# Patient Record
Sex: Female | Born: 1950 | Race: Black or African American | Hispanic: No | Marital: Single | Smoking: Never smoker
Health system: Southern US, Community
[De-identification: ages and names within clinical notes are randomized; demographics above are authoritative.]

## PROBLEM LIST (undated history)

## (undated) DIAGNOSIS — R4189 Other symptoms and signs involving cognitive functions and awareness: Secondary | ICD-10-CM

## (undated) DIAGNOSIS — I10 Essential (primary) hypertension: Secondary | ICD-10-CM

## (undated) DIAGNOSIS — E785 Hyperlipidemia, unspecified: Secondary | ICD-10-CM

## (undated) DIAGNOSIS — I739 Peripheral vascular disease, unspecified: Secondary | ICD-10-CM

## (undated) DIAGNOSIS — D649 Anemia, unspecified: Secondary | ICD-10-CM

## (undated) DIAGNOSIS — K259 Gastric ulcer, unspecified as acute or chronic, without hemorrhage or perforation: Secondary | ICD-10-CM

## (undated) DIAGNOSIS — D563 Thalassemia minor: Secondary | ICD-10-CM

## (undated) HISTORY — PX: NO PAST SURGERIES: SHX2092

## (undated) HISTORY — DX: Other symptoms and signs involving cognitive functions and awareness: R41.89

## (undated) HISTORY — DX: Gastric ulcer, unspecified as acute or chronic, without hemorrhage or perforation: K25.9

## (undated) HISTORY — DX: Peripheral vascular disease, unspecified: I73.9

## (undated) HISTORY — PX: UPPER GASTROINTESTINAL ENDOSCOPY: SHX188

## (undated) HISTORY — DX: Hyperlipidemia, unspecified: E78.5

## (undated) HISTORY — DX: Thalassemia minor: D56.3

## (undated) HISTORY — PX: COLONOSCOPY: SHX174

## (undated) HISTORY — DX: Essential (primary) hypertension: I10

## (undated) HISTORY — DX: Anemia, unspecified: D64.9

---

## 1999-12-03 ENCOUNTER — Emergency Department (HOSPITAL_COMMUNITY): Admission: EM | Admit: 1999-12-03 | Discharge: 1999-12-03 | Payer: Self-pay | Admitting: Emergency Medicine

## 1999-12-04 ENCOUNTER — Encounter: Payer: Self-pay | Admitting: Emergency Medicine

## 2000-08-11 ENCOUNTER — Encounter: Payer: Self-pay | Admitting: Emergency Medicine

## 2000-08-11 ENCOUNTER — Emergency Department (HOSPITAL_COMMUNITY): Admission: EM | Admit: 2000-08-11 | Discharge: 2000-08-11 | Payer: Self-pay | Admitting: Emergency Medicine

## 2000-08-15 ENCOUNTER — Emergency Department (HOSPITAL_COMMUNITY): Admission: EM | Admit: 2000-08-15 | Discharge: 2000-08-15 | Payer: Self-pay | Admitting: Emergency Medicine

## 2000-09-12 ENCOUNTER — Encounter: Payer: Self-pay | Admitting: Emergency Medicine

## 2000-09-12 ENCOUNTER — Emergency Department (HOSPITAL_COMMUNITY): Admission: EM | Admit: 2000-09-12 | Discharge: 2000-09-12 | Payer: Self-pay | Admitting: Emergency Medicine

## 2000-10-07 ENCOUNTER — Emergency Department (HOSPITAL_COMMUNITY): Admission: EM | Admit: 2000-10-07 | Discharge: 2000-10-07 | Payer: Self-pay | Admitting: Emergency Medicine

## 2000-10-11 ENCOUNTER — Emergency Department (HOSPITAL_COMMUNITY): Admission: EM | Admit: 2000-10-11 | Discharge: 2000-10-11 | Payer: Self-pay | Admitting: Emergency Medicine

## 2000-10-18 ENCOUNTER — Emergency Department (HOSPITAL_COMMUNITY): Admission: EM | Admit: 2000-10-18 | Discharge: 2000-10-18 | Payer: Self-pay | Admitting: Emergency Medicine

## 2000-12-26 ENCOUNTER — Emergency Department (HOSPITAL_COMMUNITY): Admission: EM | Admit: 2000-12-26 | Discharge: 2000-12-26 | Payer: Self-pay | Admitting: Emergency Medicine

## 2002-03-02 ENCOUNTER — Encounter: Payer: Self-pay | Admitting: Family Medicine

## 2002-03-02 ENCOUNTER — Encounter: Admission: RE | Admit: 2002-03-02 | Discharge: 2002-03-02 | Payer: Self-pay | Admitting: Family Medicine

## 2002-03-13 ENCOUNTER — Inpatient Hospital Stay (HOSPITAL_COMMUNITY): Admission: EM | Admit: 2002-03-13 | Discharge: 2002-03-13 | Payer: Self-pay | Admitting: Emergency Medicine

## 2002-03-13 ENCOUNTER — Encounter: Payer: Self-pay | Admitting: Emergency Medicine

## 2002-03-16 ENCOUNTER — Encounter: Payer: Self-pay | Admitting: Cardiovascular Disease

## 2002-03-16 ENCOUNTER — Ambulatory Visit (HOSPITAL_COMMUNITY): Admission: RE | Admit: 2002-03-16 | Discharge: 2002-03-16 | Payer: Self-pay | Admitting: Cardiovascular Disease

## 2005-04-29 ENCOUNTER — Emergency Department (HOSPITAL_COMMUNITY): Admission: EM | Admit: 2005-04-29 | Discharge: 2005-04-29 | Payer: Self-pay | Admitting: Emergency Medicine

## 2007-04-03 ENCOUNTER — Emergency Department (HOSPITAL_COMMUNITY): Admission: EM | Admit: 2007-04-03 | Discharge: 2007-04-03 | Payer: Self-pay | Admitting: Family Medicine

## 2008-12-03 DIAGNOSIS — R062 Wheezing: Secondary | ICD-10-CM

## 2008-12-03 DIAGNOSIS — J45909 Unspecified asthma, uncomplicated: Secondary | ICD-10-CM | POA: Insufficient documentation

## 2008-12-05 ENCOUNTER — Ambulatory Visit: Payer: Self-pay | Admitting: Internal Medicine

## 2008-12-05 DIAGNOSIS — H5310 Unspecified subjective visual disturbances: Secondary | ICD-10-CM | POA: Insufficient documentation

## 2008-12-05 DIAGNOSIS — E669 Obesity, unspecified: Secondary | ICD-10-CM

## 2008-12-05 DIAGNOSIS — K029 Dental caries, unspecified: Secondary | ICD-10-CM | POA: Insufficient documentation

## 2010-06-24 ENCOUNTER — Inpatient Hospital Stay (INDEPENDENT_AMBULATORY_CARE_PROVIDER_SITE_OTHER)
Admission: RE | Admit: 2010-06-24 | Discharge: 2010-06-24 | Disposition: A | Discharge status: Home / Self Care | Payer: Medicaid Other | Source: Ambulatory Visit

## 2010-06-24 ENCOUNTER — Ambulatory Visit (INDEPENDENT_AMBULATORY_CARE_PROVIDER_SITE_OTHER): Payer: Medicaid Other

## 2010-06-24 DIAGNOSIS — G8929 Other chronic pain: Secondary | ICD-10-CM

## 2010-06-24 DIAGNOSIS — M79609 Pain in unspecified limb: Secondary | ICD-10-CM

## 2010-07-03 ENCOUNTER — Encounter: Payer: Self-pay | Admitting: Internal Medicine

## 2010-07-03 LAB — CONVERTED CEMR LAB
ALT: 12 units/L (ref 0–35)
Alkaline Phosphatase: 92 units/L (ref 39–117)
Basophils Absolute: 0.1 10*3/uL (ref 0.0–0.1)
Cholesterol: 272 mg/dL — ABNORMAL HIGH (ref 0–200)
Creatinine, Ser: 0.73 mg/dL (ref 0.40–1.20)
Glucose, Bld: 124 mg/dL — ABNORMAL HIGH (ref 70–99)
HCT: 31.4 % — ABNORMAL LOW (ref 36.0–46.0)
HDL: 53 mg/dL (ref >39)
MCHC: 26.8 g/dL — ABNORMAL LOW (ref 30.0–36.0)
MCV: 54.1 fL — ABNORMAL LOW (ref 78.0–100.0)
Monocytes Absolute: 0.5 10*3/uL (ref 0.1–1.0)
Neutrophils Relative %: 42 % — ABNORMAL LOW (ref 43–77)
RBC: 5.8 M/uL — ABNORMAL HIGH (ref 3.87–5.11)
Sodium: 137 meq/L (ref 135–145)
T pallidum Antibodies (TP-PA): >8 — ABNORMAL HIGH (ref <0.90)
Total Bilirubin: 0.3 mg/dL (ref 0.3–1.2)
Total CHOL/HDL Ratio: 5.1
Total Protein: 7.2 g/dL (ref 6.0–8.3)
WBC: 8.3 10*3/uL (ref 4.0–10.5)

## 2010-09-25 NOTE — Discharge Summary (Signed)
NAME:  [name], [name]                         ACCOUNT NO.:  [id number]   MEDICAL RECORD NO.:  [id number]                   PATIENT TYPE:  INP   LOCATION:  6526                                 FACILITY:  [facility]   PHYSICIAN:  Ricki Rodriguez, M.D.               DATE OF BIRTH:  May 27, 1950   DATE OF ADMISSION:  03/12/2002  DATE OF DISCHARGE:  03/13/2002                                 DISCHARGE SUMMARY   PRINCIPAL DIAGNOSIS:  Chest pain.   DISCHARGE MEDICATIONS:  1. Aspirin 81 mg one p.o. daily.  2. Norvasc 2.5 mg one p.o. daily.  3. Nitroglycerin 0.4 mg one tablet one under the tongue as needed every five     minutes x3 for chest pain.   ACTIVITY:  As tolerated.   DIET:  Low fat, low salt diet.   DISCHARGE INSTRUCTIONS:  The patient to have nuclear stress test on March 16, 2002, on outpatient basis.   FOLLOW UP:  The patient will follow up with Dr. Orpah Cobb in two weeks.  The patient is to call [phone] for appointment.   HISTORY OF PRESENT ILLNESS:  This 60 year old black female complained of  dull substernal chest pain nonradiating.  She had similar pain one to three  days prior to the admission. She has no cardiac risk factors.   PHYSICAL EXAMINATION:  GENERAL APPEARANCE:  The patient was alert and  oriented x3.  VITAL SIGNS:  Temperature 99.6, pulse 72, respiratory rate 20, blood  pressure 116/53, height 5 feet 4 inches, weight 180 pounds.  Oxygen  saturation was 98%.  HEENT:  Head normocephalic and atraumatic.  Eyes brown with conjunctiva  pink.  Sclerae nonicteric.  Ears, nose and throat revealed mucous membranes  pink and moist.  NECK:  No JVD.  LUNGS:  Clear bilaterally.  CARDIOVASCULAR:  Normal S1 and S2.  ABDOMEN:  Soft.  EXTREMITIES:  No clubbing, cyanosis, or edema.  NEUROLOGIC:  Bilateral equal grips.   LABORATORY DATA:  CK-MB and troponin I unremarkable.  Elevated cholesterol,  triglyceride levels.  CK-MB normal x2.  Sugar borderline at 125.   Hemoglobin  14, hematocrit 41.   Chest x-ray without any acute disease.   EKG sinus rhythm with a possible inferior infarct.    HOSPITAL COURSE:  The patient was admitted to telemetry and myocardial  infarction was ruled out.  The patient was then encouraged to go for nuclear  stress test versus cardiac catheterization; however, she wanted to go home  and she was given aspirin, Norvasc, nitroglycerin and outpatient nuclear  stress test.  She had had her cholesterol level managed on an outpatient  basis and she was discharged home in satisfactory condition.  Ricki Rodriguez, M.D.    ASK/MEDQ  D:  05/11/2002  T:  05/11/2002  Job:  [id number]

## 2010-12-18 ENCOUNTER — Other Ambulatory Visit: Payer: Self-pay | Admitting: Neurology

## 2010-12-18 DIAGNOSIS — F70 Mild intellectual disabilities: Secondary | ICD-10-CM

## 2011-01-05 ENCOUNTER — Other Ambulatory Visit: Payer: Medicaid Other

## 2011-01-06 ENCOUNTER — Ambulatory Visit
Admission: RE | Admit: 2011-01-06 | Discharge: 2011-01-06 | Disposition: A | Discharge status: Home / Self Care | Payer: Medicaid Other | Source: Ambulatory Visit | Attending: Neurology | Admitting: Neurology

## 2011-01-06 DIAGNOSIS — F70 Mild intellectual disabilities: Secondary | ICD-10-CM

## 2011-10-10 ENCOUNTER — Encounter (HOSPITAL_COMMUNITY): Payer: Self-pay

## 2011-10-10 ENCOUNTER — Emergency Department (HOSPITAL_COMMUNITY)
Admission: EM | Admit: 2011-10-10 | Discharge: 2011-10-10 | Disposition: A | Discharge status: Home / Self Care | Payer: Medicaid Other | Attending: Emergency Medicine | Admitting: Emergency Medicine

## 2011-10-10 DIAGNOSIS — H5710 Ocular pain, unspecified eye: Secondary | ICD-10-CM | POA: Insufficient documentation

## 2011-10-10 DIAGNOSIS — E119 Type 2 diabetes mellitus without complications: Secondary | ICD-10-CM | POA: Insufficient documentation

## 2011-10-10 DIAGNOSIS — H109 Unspecified conjunctivitis: Secondary | ICD-10-CM

## 2011-10-10 LAB — GLUCOSE, CAPILLARY: Glucose-Capillary: 150 mg/dL — ABNORMAL HIGH (ref 70–99)

## 2011-10-10 MED ORDER — TOBRAMYCIN-DEXAMETHASONE 0.3-0.1 % OP SUSP
2.0000 [drp] | Freq: Once | OPHTHALMIC | Status: AC
  Administered 2011-10-10: 2 [drp] via OPHTHALMIC
  Filled 2011-10-10: qty 2.5

## 2011-10-10 NOTE — ED Provider Notes (Signed)
History     CSN: [id number]  Arrival date & time 10/10/11  1306   First MD Initiated Contact with Patient 10/10/11 1354      2:25 PM HPI Patient reports she is ready to go home. I asked her why she came to the emergency department she states she thought her sugar was high but was checked and normal in the emergency department. CBG was 150. When I asked her about her red eye which she was complaining of initially she states "it's fine it's my high sugar" patient reports that his red for 2 days. Associated with excessive tearing and white mucus production in the morning. Denies eye irritation, visual change, eye pain, itchiness, swelling. Patient is a 61 y.o. female presenting with eye problem. The history is provided by the patient.  Eye Problem  This is a new problem. The current episode started 2 days ago. The problem has not changed since onset.There is pain in the right eye. The patient is experiencing no pain. There is no history of trauma to the eye. There is no known exposure to pink eye. She does not wear contacts. Associated symptoms include discharge and eye redness. Pertinent negatives include no numbness, no blurred vision, no double vision, no photophobia, no nausea, no vomiting, no tingling, no weakness and no itching.    Past Medical History  Diagnosis Date  . Diabetes mellitus     No past surgical history on file.  No family history on file.  History  Substance Use Topics  . Smoking status: Never Smoker   . Smokeless tobacco: Not on file  . Alcohol Use: No    OB History    Grav Para Term Preterm Abortions TAB SAB Ect Mult Living                  Review of Systems  Constitutional: Negative for fever and chills.  HENT: Negative for congestion, sore throat, rhinorrhea, neck pain and neck stiffness.   Eyes: Positive for discharge and redness. Negative for blurred vision, double vision, photophobia, pain, itching and visual disturbance.  Gastrointestinal: Negative  for nausea, vomiting and abdominal pain.  Skin: Negative for itching.  Neurological: Negative for dizziness, tingling, weakness, numbness and headaches.  All other systems reviewed and are negative.    Allergies  Review of patient's allergies indicates no known allergies.  Home Medications   Current Outpatient Rx  Name Route Sig Dispense Refill  . ASPIRIN EC 81 MG PO TBEC Oral Take 81 mg by mouth daily.    Marland Kitchen METFORMIN HCL 500 MG PO TABS Oral Take 500 mg by mouth 2 (two) times daily with a meal.    . PRESCRIPTION MEDICATION Oral Take 1 tablet by mouth daily. Cholesterol med.    Marland Kitchen PRESCRIPTION MEDICATION Oral Take 1 tablet by mouth daily. "Suagr pill"      BP 135/63  Pulse 78  Temp 98.4 F (36.9 C)  Resp 18  SpO2 96%  Physical Exam  Vitals reviewed. Constitutional: She is oriented to person, place, and time. Vital signs are normal. She appears well-developed and well-nourished.  HENT:  Head: Normocephalic and atraumatic.  Eyes: EOM are normal. Pupils are equal, round, and reactive to light. Right eye exhibits no discharge. Left eye exhibits no discharge. Right conjunctiva is injected. Left conjunctiva is not injected.  Fundoscopic exam:      The right eye shows no hemorrhage.       The left eye shows no hemorrhage.  Neck: Normal  range of motion. Neck supple.  Cardiovascular: Normal rate, regular rhythm and normal heart sounds.  Exam reveals no friction rub.   No murmur heard. Pulmonary/Chest: Effort normal and breath sounds normal. She has no wheezes. She has no rhonchi. She has no rales. She exhibits no tenderness.  Musculoskeletal: Normal range of motion.  Neurological: She is alert and oriented to person, place, and time.  Skin: Skin is warm and dry. No rash noted. No erythema. No pallor.    ED Course  Procedures   Results for orders placed during the hospital encounter of 10/10/11  GLUCOSE, CAPILLARY      Component Value Range   Glucose-Capillary 150 (*) 70 - 99  (mg/dL)    MDM  Pt reports she is ready to go and does not want any further testing. Eye exam that I was able to perform was benign. No trauma or eye ain today. Likely concjuntivitis. However I provided patient with referrals for ophthalmology since she did not want further examination other than using ophthalmoscope. Advised immediate return for worsening symptoms such as high pain, or change in vision. Also advised patient to followup with primary care physician regarding blood glucose.       Thomasene Lot, PA-C 10/10/11 1449

## 2011-10-10 NOTE — ED Provider Notes (Signed)
Medical screening examination/treatment/procedure(s) were performed by non-physician practitioner and as supervising physician I was immediately available for consultation/collaboration.  Doug Sou, MD 10/10/11 [phone]

## 2011-10-10 NOTE — ED Notes (Addendum)
Pt presents to department for evaluation of R eye redness, itching and discomfort. Ongoing x2 days. Pt states eye is watery and itching. Redness noted to sclera upon exam. Pupils equal and reactive bilaterally. Pt is alert and oriented x4. Also states she is concerned that her blood sugar could be high. No signs of distress noted at the time.

## 2011-10-10 NOTE — ED Notes (Signed)
sts needs eye checked, right eye is red, andalso sts sugar is high because she has been eating syrup today.

## 2011-10-10 NOTE — Discharge Instructions (Signed)
Conjunctivitis Conjunctivitis is commonly called "pink eye." Conjunctivitis can be caused by bacterial or viral infection, allergies, or injuries. There is usually redness of the lining of the eye, itching, discomfort, and sometimes discharge. There may be deposits of matter along the eyelids. A viral infection usually causes a watery discharge, while a bacterial infection causes a yellowish, thick discharge. Pink eye is very contagious and spreads by direct contact. You may be given antibiotic eyedrops as part of your treatment. Before using your eye medicine, remove all drainage from the eye by washing gently with warm water and cotton balls. Continue to use the medication until you have awakened 2 mornings in a row without discharge from the eye. Do not rub your eye. This increases the irritation and helps spread infection. Use separate towels from other household members. Wash your hands with soap and water before and after touching your eyes. Use cold compresses to reduce pain and sunglasses to relieve irritation from light. Do not wear contact lenses or wear eye makeup until the infection is gone. SEEK MEDICAL CARE IF:   Your symptoms are not better after 3 days of treatment.   You have increased pain or trouble seeing.   The outer eyelids become very red or swollen.  Document Released: 06/03/2004 Document Revised: 04/15/2011 Document Reviewed: 04/26/2005 [facility] Patient Information 2012 [place], [place].Conjunctivitis Conjunctivitis is commonly called "pink eye." Conjunctivitis can be caused by bacterial or viral infection, allergies, or injuries. There is usually redness of the lining of the eye, itching, discomfort, and sometimes discharge. There may be deposits of matter along the eyelids. A viral infection usually causes a watery discharge, while a bacterial infection causes a yellowish, thick discharge. Pink eye is very contagious and spreads by direct contact. You may be given antibiotic  eyedrops as part of your treatment. Before using your eye medicine, remove all drainage from the eye by washing gently with warm water and cotton balls. Continue to use the medication until you have awakened 2 mornings in a row without discharge from the eye. Do not rub your eye. This increases the irritation and helps spread infection. Use separate towels from other household members. Wash your hands with soap and water before and after touching your eyes. Use cold compresses to reduce pain and sunglasses to relieve irritation from light. Do not wear contact lenses or wear eye makeup until the infection is gone. SEEK MEDICAL CARE IF:   Your symptoms are not better after 3 days of treatment.   You have increased pain or trouble seeing.   The outer eyelids become very red or swollen.  Document Released: 06/03/2004 Document Revised: 04/15/2011 Document Reviewed: 04/26/2005 [facility] Patient Information 2012 [place], [place].

## 2011-11-12 ENCOUNTER — Other Ambulatory Visit: Payer: Self-pay | Admitting: Family Medicine

## 2011-11-12 DIAGNOSIS — Z1231 Encounter for screening mammogram for malignant neoplasm of breast: Secondary | ICD-10-CM

## 2011-11-15 ENCOUNTER — Other Ambulatory Visit (HOSPITAL_COMMUNITY)
Admission: RE | Admit: 2011-11-15 | Discharge: 2011-11-15 | Disposition: A | Discharge status: Home / Self Care | Payer: Medicaid Other | Source: Ambulatory Visit | Attending: Obstetrics and Gynecology | Admitting: Obstetrics and Gynecology

## 2011-11-15 DIAGNOSIS — Z01419 Encounter for gynecological examination (general) (routine) without abnormal findings: Secondary | ICD-10-CM | POA: Insufficient documentation

## 2011-11-15 DIAGNOSIS — Z1159 Encounter for screening for other viral diseases: Secondary | ICD-10-CM | POA: Insufficient documentation

## 2011-11-15 DIAGNOSIS — Z113 Encounter for screening for infections with a predominantly sexual mode of transmission: Secondary | ICD-10-CM | POA: Insufficient documentation

## 2011-12-01 ENCOUNTER — Ambulatory Visit
Admission: RE | Admit: 2011-12-01 | Discharge: 2011-12-01 | Disposition: A | Discharge status: Home / Self Care | Payer: PRIVATE HEALTH INSURANCE | Source: Ambulatory Visit | Attending: Family Medicine | Admitting: Family Medicine

## 2011-12-01 DIAGNOSIS — Z1231 Encounter for screening mammogram for malignant neoplasm of breast: Secondary | ICD-10-CM

## 2012-06-06 IMAGING — CR DG FOOT COMPLETE 3+V*L*
3 series · 3 of 3 positions shown · non-contrast
Comparison: None

CLINICAL DATA: Left foot pain.  History of prior surgery.

LEFT FOOT - COMPLETE 3+ VIEW

[view not recorded (1 of 3)]
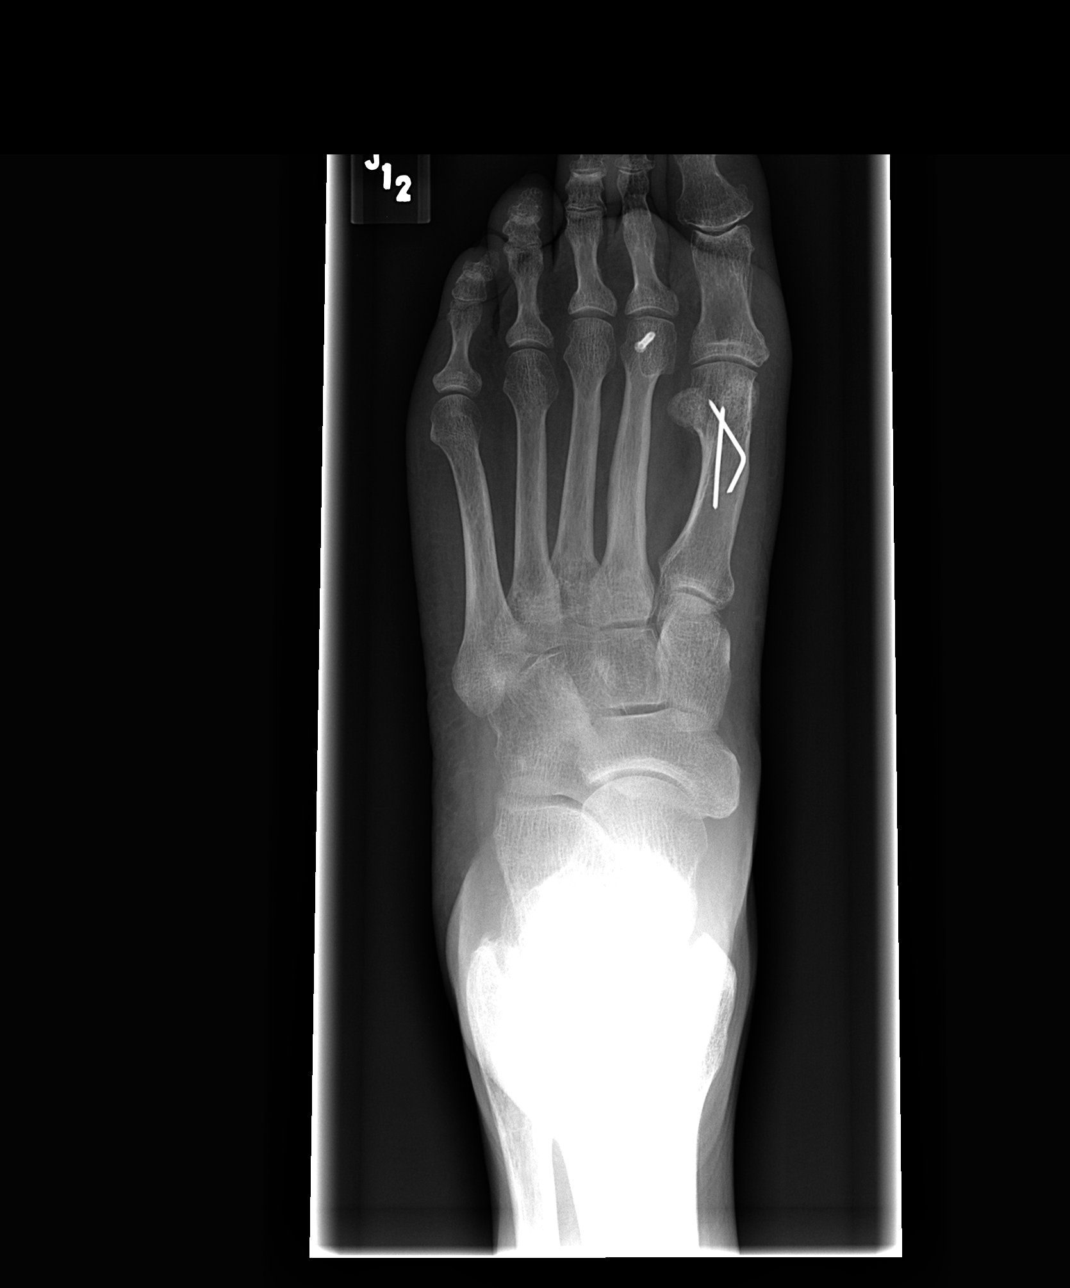

[view not recorded (2 of 3)]
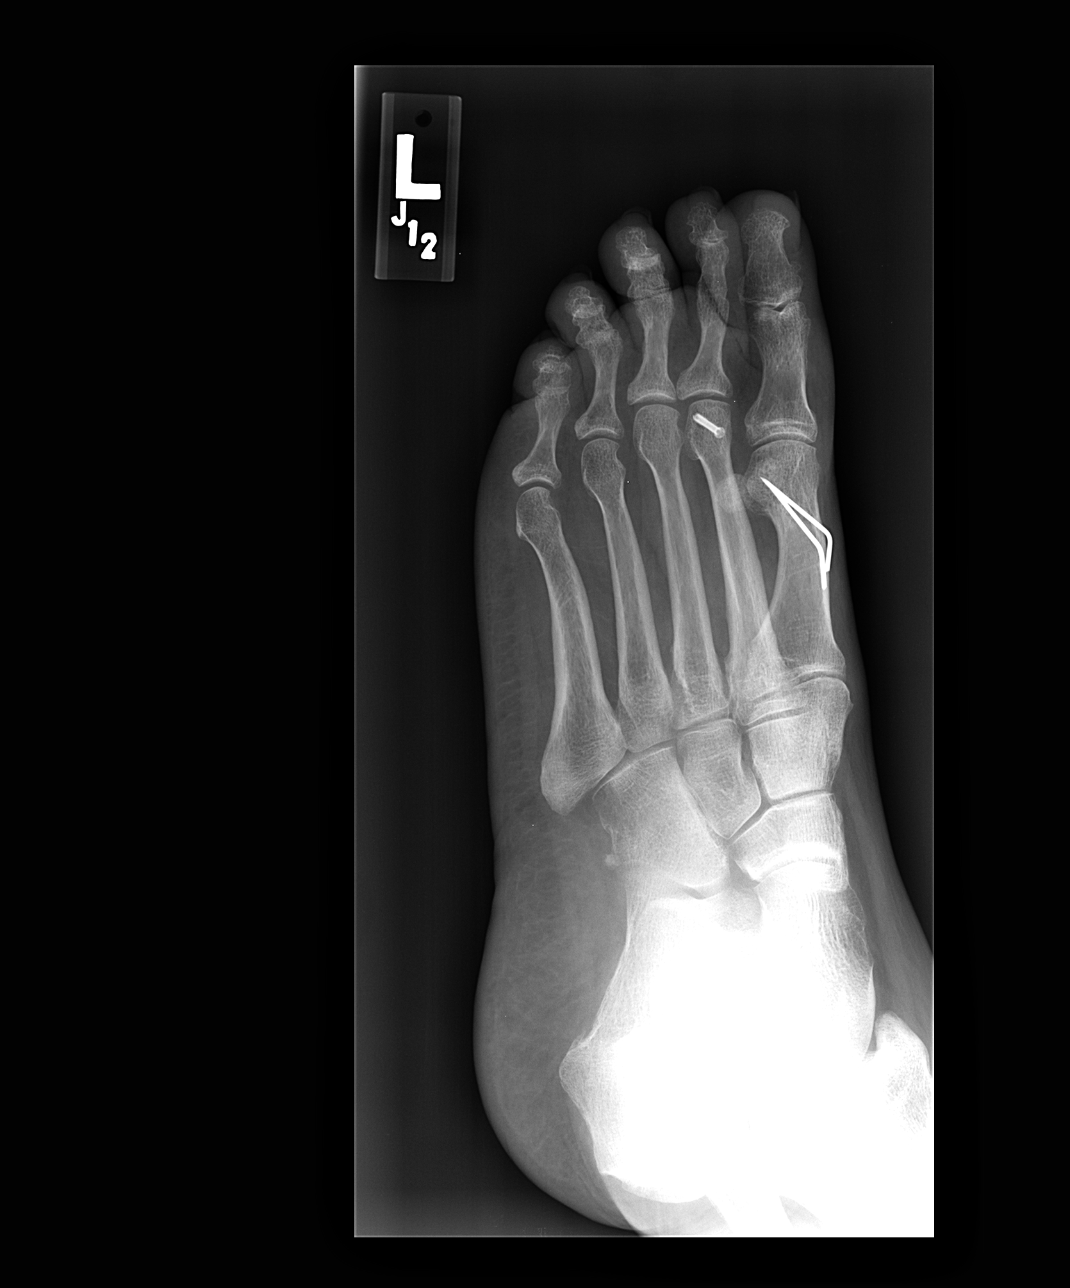

[view not recorded (3 of 3)]
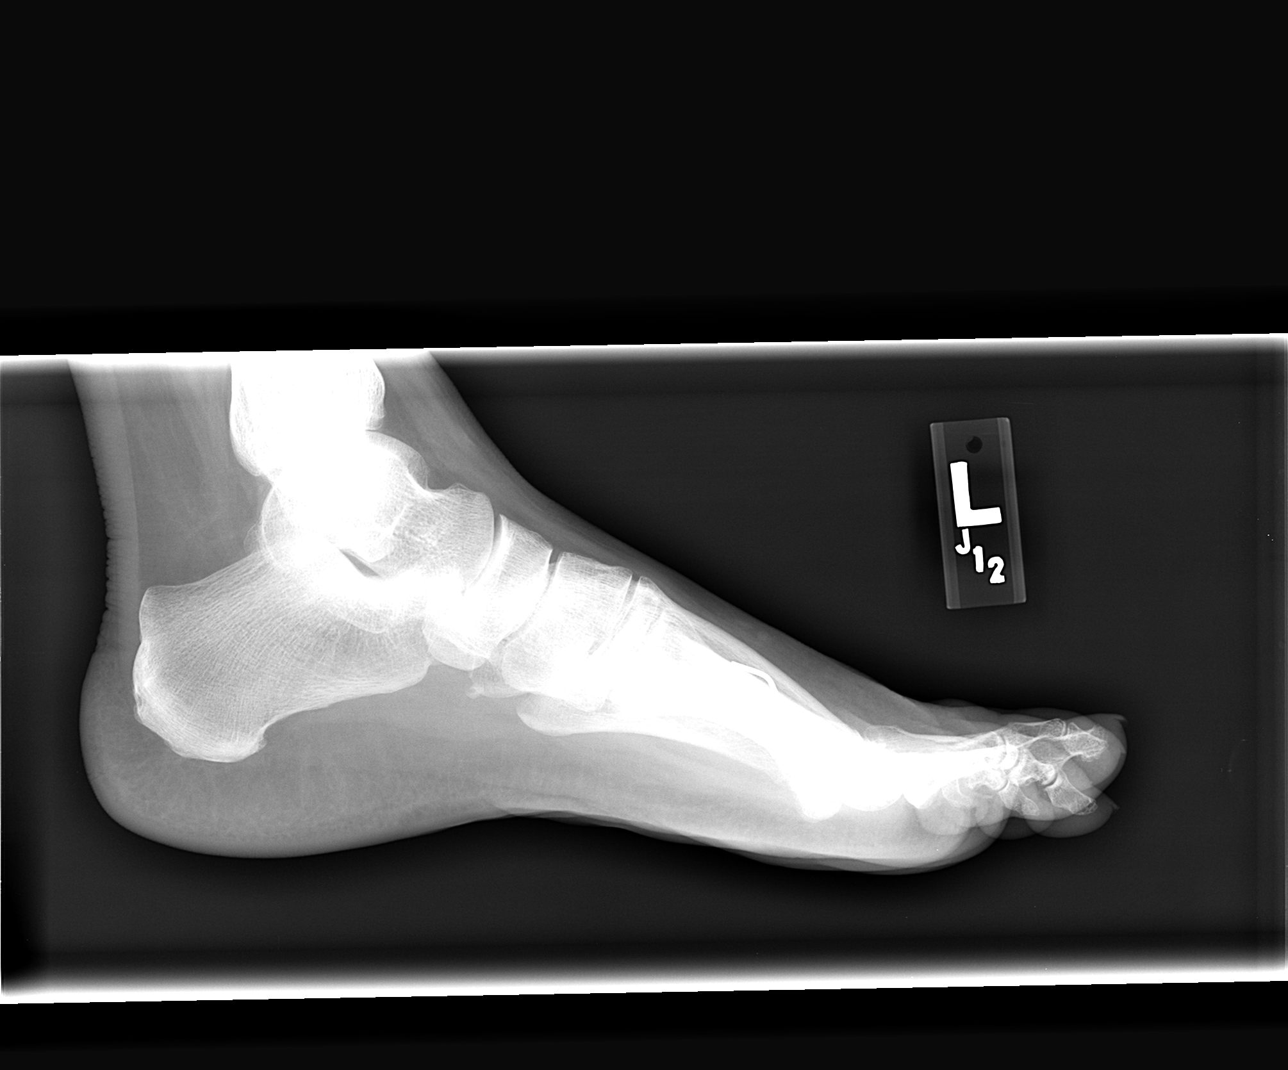

[3 of 3 positions shown; findings below may reference images not displayed]

FINDINGS: There is no evidence of acute fracture, subluxation or
dislocation.
Surgical wires/screws within the first and second metatarsals
noted.
There appears to be fusion at the second toe PIP joint is noted.
The Lisfranc joints are intact.
The joint spaces are otherwise unremarkable.
IMPRESSION: No evidence of acute abnormality.

Surgical hardware within these first and second metatarsals.  No
definite complicating features.

## 2013-03-21 ENCOUNTER — Encounter: Payer: Self-pay | Admitting: Gastroenterology

## 2013-05-15 ENCOUNTER — Ambulatory Visit (AMBULATORY_SURGERY_CENTER): Payer: Self-pay

## 2013-05-15 VITALS — Ht 64.0 in | Wt 181.8 lb

## 2013-05-15 DIAGNOSIS — Z1211 Encounter for screening for malignant neoplasm of colon: Secondary | ICD-10-CM

## 2013-05-15 DIAGNOSIS — D649 Anemia, unspecified: Secondary | ICD-10-CM

## 2013-05-15 MED ORDER — NA SULFATE-K SULFATE-MG SULF 17.5-3.13-1.6 GM/177ML PO SOLN
ORAL | Status: DC
Start: 2013-05-15 — End: 2014-04-16

## 2013-05-29 ENCOUNTER — Encounter: Payer: Self-pay | Admitting: Gastroenterology

## 2013-05-29 ENCOUNTER — Ambulatory Visit: Payer: PRIVATE HEALTH INSURANCE | Admitting: Gastroenterology

## 2013-05-29 VITALS — BP 141/68 | HR 79 | Temp 96.7°F | Ht 64.0 in | Wt 181.0 lb

## 2013-05-29 LAB — GLUCOSE, CAPILLARY: GLUCOSE-CAPILLARY: 85 mg/dL (ref 70–99)

## 2013-05-29 MED ORDER — SODIUM CHLORIDE 0.9 % IV SOLN
500.0000 mL | INTRAVENOUS | Status: DC
Start: 2013-05-29 — End: 2013-05-30

## 2013-05-29 NOTE — Progress Notes (Signed)
Colonoscopy canceled. [name] drank colon prep Friday (4 days prior to procedure) and had milk before arriving. Advised patient we will call her later with future appointment date and time at [phone].

## 2013-05-30 ENCOUNTER — Telehealth: Payer: Self-pay | Admitting: *Deleted

## 2013-06-22 NOTE — Telephone Encounter (Signed)
  Follow up Call-  Call back number 05/29/2013  Post procedure Call Back phone  # [phone]  Permission to leave phone message Yes     Patient questions:  Do you have a fever, pain , or abdominal swelling? no Pain Score  0 *  Have you tolerated food without any problems? yes  Have you been able to return to your normal activities? yes  Do you have any questions about your discharge instructions: Diet   no Medications  no Follow up visit  no  Do you have questions or concerns about your Care? no  Actions: * If pain score is 4 or above: No action needed, pain <4.

## 2014-02-26 ENCOUNTER — Encounter: Payer: PRIVATE HEALTH INSURANCE | Admitting: Gastroenterology

## 2014-03-27 ENCOUNTER — Ambulatory Visit (AMBULATORY_SURGERY_CENTER): Payer: Self-pay

## 2014-03-27 ENCOUNTER — Encounter: Payer: Self-pay | Admitting: *Deleted

## 2014-03-27 ENCOUNTER — Telehealth: Payer: Self-pay | Admitting: Gastroenterology

## 2014-03-27 VITALS — Ht 66.0 in | Wt 173.0 lb

## 2014-03-27 DIAGNOSIS — Z1211 Encounter for screening for malignant neoplasm of colon: Secondary | ICD-10-CM

## 2014-03-27 MED ORDER — NA SULFATE-K SULFATE-MG SULF 17.5-3.13-1.6 GM/177ML PO SOLN: ORAL | Status: DC

## 2014-03-27 NOTE — Progress Notes (Signed)
Per pt, no allergies to soy or egg products.Pt not taking any weight loss meds or using  O2 at home.   Chad(son) was with the pt during her pre-visit to help with paperwork and answering questions

## 2014-03-27 NOTE — Telephone Encounter (Signed)
Per dr Deatra Ina ok to use movi prep. Called [place], pt's son and informed that I called the pharmacy and gave them the info for his mom to receive a free prep.  He states he appreciates that. i also informed him that i will mail him new instructions for the movi prep.  He verbalized understanding of all these instructions given.  Lelan Pons PV

## 2014-04-16 ENCOUNTER — Encounter: Payer: Self-pay | Admitting: Gastroenterology

## 2014-04-16 ENCOUNTER — Ambulatory Visit (AMBULATORY_SURGERY_CENTER): Payer: PRIVATE HEALTH INSURANCE | Admitting: Gastroenterology

## 2014-04-16 VITALS — BP 141/104 | HR 75 | Temp 96.2°F | Resp 29 | Ht 66.0 in | Wt 173.0 lb

## 2014-04-16 DIAGNOSIS — Z1211 Encounter for screening for malignant neoplasm of colon: Secondary | ICD-10-CM | POA: Diagnosis present

## 2014-04-16 DIAGNOSIS — K648 Other hemorrhoids: Secondary | ICD-10-CM

## 2014-04-16 DIAGNOSIS — K635 Polyp of colon: Secondary | ICD-10-CM | POA: Diagnosis not present

## 2014-04-16 DIAGNOSIS — D125 Benign neoplasm of sigmoid colon: Secondary | ICD-10-CM

## 2014-04-16 LAB — GLUCOSE, CAPILLARY
GLUCOSE-CAPILLARY: 120 mg/dL — AB (ref 70–99)
Glucose-Capillary: 153 mg/dL — ABNORMAL HIGH (ref 70–99)

## 2014-04-16 MED ORDER — SODIUM CHLORIDE 0.9 % IV SOLN
500.0000 mL | INTRAVENOUS | Status: DC
Start: 2014-04-16 — End: 2014-04-17

## 2014-04-16 NOTE — Progress Notes (Signed)
Called to room to assist during endoscopic procedure.  Patient ID and intended procedure confirmed with present staff. Received instructions for my participation in the procedure from the performing physician.  

## 2014-04-16 NOTE — Progress Notes (Signed)
POA- is sister, she stays in [place], but son is here with mother to help.

## 2014-04-16 NOTE — Op Note (Signed)
[facility]  [address] [place], [address]   COLONOSCOPY PROCEDURE REPORT  PATIENT: [name], Service  MR#: [id number] BIRTHDATE: 1951-01-09 , 63  yrs. old GENDER: female ENDOSCOPIST: Inda Castle, MD REFERRED [id number] Bradd Burner, M.D. PROCEDURE DATE:  04/16/2014 PROCEDURE:   Colonoscopy with snare polypectomy First Screening Colonoscopy - Avg.  risk and is 50 yrs.  old or older Yes.  Prior Negative Screening - Now for repeat screening. N/A  History of Adenoma - Now for follow-up colonoscopy & has been > or = to 3 yrs.  N/A  Polyps Removed Today? Yes. ASA CLASS:   Class II INDICATIONS:first colonoscopy. MEDICATIONS: Monitored anesthesia care and Propofol 200 mg IV  DESCRIPTION OF PROCEDURE:   After the risks benefits and alternatives of the procedure were thoroughly explained, informed consent was obtained.  The digital rectal exam revealed no abnormalities of the rectum.   The LB CF-H180AL Loaner [id number] endoscope was introduced through the anus and advanced to the proximal transverse colon. No adverse events experienced.   Limited by poor preparation.  There was iron laden solid stool throughout the right colon and thick liquid stool in the left colon. The quality of the prep was Suprep poor  The instrument was then slowly withdrawn as the colon was fully examined.      COLON FINDINGS: Two flat polyps measuring 3 mm in size were found in the sigmoid colon.  Polypectomies were performed.  The resection was complete, the polyp tissue was completely retrieved and sent to histology.   Internal hemorrhoids were found.  Retroflexed views revealed no abnormalities. The time to cecum=4 minutes 25 seconds. Withdrawal time=9 minutes 46 seconds.  The scope was withdrawn and the procedure completed. COMPLICATIONS: There were no immediate complications.  ENDOSCOPIC IMPRESSION: 1.   Two flat polyps were found in the sigmoid colon; polypectomies were performed 2.    Internal hemorrhoids  Limited and incomplete exam due to poor prep    RECOMMENDATIONS: If polyps are adenomatous then repeat colonoscopy in 1-3 years, otherwise 5 years  eSigned:  Inda Castle, MD 04/16/2014 10:39 AM   cc:   PATIENT NAME:  Liela, Rylee MR#: [id number]

## 2014-04-16 NOTE — Patient Instructions (Signed)
Discharge instructions given. Handouts on polyps and hemorrhoids. Resume previous medications. YOU HAD AN ENDOSCOPIC PROCEDURE TODAY AT THE [facility]: Refer to the procedure report that was given to you for any specific questions about what was found during the examination.  If the procedure report does not answer your questions, please call your gastroenterologist to clarify.  If you requested that your care partner not be given the details of your procedure findings, then the procedure report has been included in a sealed envelope for you to review at your convenience later.  YOU SHOULD EXPECT: Some feelings of bloating in the abdomen. Passage of more gas than usual.  Walking can help get rid of the air that was put into your GI tract during the procedure and reduce the bloating. If you had a lower endoscopy (such as a colonoscopy or flexible sigmoidoscopy) you may notice spotting of blood in your stool or on the toilet paper. If you underwent a bowel prep for your procedure, then you may not have a normal bowel movement for a few days.  DIET: Your first meal following the procedure should be a light meal and then it is ok to progress to your normal diet.  A half-sandwich or bowl of soup is an example of a good first meal.  Heavy or fried foods are harder to digest and may make you feel nauseous or bloated.  Likewise meals heavy in dairy and vegetables can cause extra gas to form and this can also increase the bloating.  Drink plenty of fluids but you should avoid alcoholic beverages for 24 hours.  ACTIVITY: Your care partner should take you home directly after the procedure.  You should plan to take it easy, moving slowly for the rest of the day.  You can resume normal activity the day after the procedure however you should NOT DRIVE or use heavy machinery for 24 hours (because of the sedation medicines used during the test).    SYMPTOMS TO REPORT IMMEDIATELY: A gastroenterologist can  be reached at any hour.  During normal business hours, 8:30 AM to 5:00 PM Monday through Friday, call [phone].  After hours and on weekends, please call the GI answering service at [phone] who will take a message and have the physician on call contact you.   Following lower endoscopy (colonoscopy or flexible sigmoidoscopy):  Excessive amounts of blood in the stool  Significant tenderness or worsening of abdominal pains  Swelling of the abdomen that is new, acute  Fever of 100F or higher FOLLOW UP: If any biopsies were taken you will be contacted by phone or by letter within the next 1-3 weeks.  Call your gastroenterologist if you have not heard about the biopsies in 3 weeks.  Our staff will call the home number listed on your records the next business day following your procedure to check on you and address any questions or concerns that you may have at that time regarding the information given to you following your procedure. This is a courtesy call and so if there is no answer at the home number and we have not heard from you through the emergency physician on call, we will assume that you have returned to your regular daily activities without incident.  SIGNATURES/CONFIDENTIALITY: You and/or your care partner have signed paperwork which will be entered into your electronic medical record.  These signatures attest to the fact that that the information above on your After Visit Summary has been reviewed and   and is understood.  Full responsibility of the confidentiality of this discharge information lies with you and/or your care-partner. 

## 2014-04-16 NOTE — Progress Notes (Signed)
A/ox3 pleased with MAC, report to Celia RN 

## 2014-04-17 ENCOUNTER — Telehealth: Payer: Self-pay

## 2014-04-17 NOTE — Telephone Encounter (Signed)
  Follow up Call-  Call back number 04/16/2014 05/29/2013  Post procedure Call Back phone  # [phone], SON NUMBER OK TO SPEAK WITH [phone]  Permission to leave phone message No Yes     Patient questions:  Do you have a fever, pain , or abdominal swelling? No. Pain Score  0 *  Have you tolerated food without any problems? Yes.    Have you been able to return to your normal activities? Yes.    Do you have any questions about your discharge instructions: Diet   No. Medications  No. Follow up visit  No.  Do you have questions or concerns about your Care? No.  Actions: * If pain score is 4 or above: No action needed, pain <4.

## 2014-04-23 ENCOUNTER — Encounter: Payer: Self-pay | Admitting: Gastroenterology

## 2014-05-18 ENCOUNTER — Encounter (HOSPITAL_COMMUNITY): Payer: Self-pay

## 2014-05-18 ENCOUNTER — Emergency Department (HOSPITAL_COMMUNITY): Payer: Medicare (Managed Care)

## 2014-05-18 ENCOUNTER — Emergency Department (HOSPITAL_COMMUNITY)
Admission: EM | Admit: 2014-05-18 | Discharge: 2014-05-18 | Disposition: A | Discharge status: Home / Self Care | Payer: Medicare (Managed Care) | Attending: Emergency Medicine | Admitting: Emergency Medicine

## 2014-05-18 DIAGNOSIS — E785 Hyperlipidemia, unspecified: Secondary | ICD-10-CM | POA: Insufficient documentation

## 2014-05-18 DIAGNOSIS — D649 Anemia, unspecified: Secondary | ICD-10-CM | POA: Insufficient documentation

## 2014-05-18 DIAGNOSIS — E119 Type 2 diabetes mellitus without complications: Secondary | ICD-10-CM | POA: Insufficient documentation

## 2014-05-18 DIAGNOSIS — Z79899 Other long term (current) drug therapy: Secondary | ICD-10-CM | POA: Diagnosis not present

## 2014-05-18 DIAGNOSIS — R079 Chest pain, unspecified: Secondary | ICD-10-CM

## 2014-05-18 DIAGNOSIS — R0789 Other chest pain: Secondary | ICD-10-CM | POA: Insufficient documentation

## 2014-05-18 DIAGNOSIS — Z7982 Long term (current) use of aspirin: Secondary | ICD-10-CM | POA: Diagnosis not present

## 2014-05-18 LAB — BASIC METABOLIC PANEL
Anion gap: 9 (ref 5–15)
BUN: 7 mg/dL (ref 6–23)
CHLORIDE: 108 meq/L (ref 96–112)
CO2: 24 mmol/L (ref 19–32)
Calcium: 8.9 mg/dL (ref 8.4–10.5)
Creatinine, Ser: 0.87 mg/dL (ref 0.50–1.10)
GFR calc non Af Amer: 70 mL/min — ABNORMAL LOW (ref >90)
GFR, EST AFRICAN AMERICAN: 81 mL/min — AB (ref >90)
Glucose, Bld: 169 mg/dL — ABNORMAL HIGH (ref 70–99)
Potassium: 4.4 mmol/L (ref 3.5–5.1)
Sodium: 141 mmol/L (ref 135–145)

## 2014-05-18 LAB — CBC
HCT: 36.7 % (ref 36.0–46.0)
Hemoglobin: 11.2 g/dL — ABNORMAL LOW (ref 12.0–15.0)
MCH: 20.4 pg — ABNORMAL LOW (ref 26.0–34.0)
MCHC: 30.5 g/dL (ref 30.0–36.0)
MCV: 67 fL — ABNORMAL LOW (ref 78.0–100.0)
PLATELETS: 209 10*3/uL (ref 150–400)
RBC: 5.48 MIL/uL — ABNORMAL HIGH (ref 3.87–5.11)
RDW: 15.8 % — ABNORMAL HIGH (ref 11.5–15.5)
WBC: 7 10*3/uL (ref 4.0–10.5)

## 2014-05-18 LAB — I-STAT TROPONIN, ED
TROPONIN I, POC: 0 ng/mL (ref 0.00–0.08)
TROPONIN I, POC: 0 ng/mL (ref 0.00–0.08)

## 2014-05-18 NOTE — Discharge Instructions (Signed)

## 2014-05-18 NOTE — ED Provider Notes (Signed)
CSN: [id number]     Arrival date & time 05/18/14  1748 History   First MD Initiated Contact with Patient 05/18/14 1750     Chief Complaint  Patient presents with  . Chest Pain     (Consider location/radiation/quality/duration/timing/severity/associated sxs/prior Treatment) Patient is a 64 y.o. female presenting with chest pain. The history is provided by the patient.  Chest Pain Pain location:  L chest Associated symptoms: no abdominal pain, no back pain, no headache, no nausea, no numbness, no shortness of breath, not vomiting and no weakness    patient presents with left-sided chest pain. It began while watching TV and lasts around 10 minutes. A sharp. It is worse with movements. No shortness of breath. Is feeling much better now. No fevers. She has not had pains at this before. No trauma. No recent travel. She does not smoke. No known previous coronary artery disease.  Past Medical History  Diagnosis Date  . Diabetes mellitus   . Anemia   . Cognitive impairment     lifelong  . Hyperlipidemia    Past Surgical History  Procedure Laterality Date  . No past surgeries     No family history on file. History  Substance Use Topics  . Smoking status: Never Smoker   . Smokeless tobacco: Never Used  . Alcohol Use: No   OB History    No data available     Review of Systems  Constitutional: Negative for activity change and appetite change.  Eyes: Negative for pain.  Respiratory: Negative for chest tightness and shortness of breath.   Cardiovascular: Positive for chest pain. Negative for leg swelling.  Gastrointestinal: Negative for nausea, vomiting, abdominal pain and diarrhea.  Genitourinary: Negative for flank pain.  Musculoskeletal: Negative for back pain and neck stiffness.  Skin: Negative for rash.  Neurological: Negative for weakness, numbness and headaches.  Psychiatric/Behavioral: Negative for behavioral problems.      Allergies  Review of patient's allergies  indicates no known allergies.  Home Medications   Prior to Admission medications   Medication Sig Start Date End Date Taking? Authorizing Provider  aspirin EC 81 MG tablet Take 81 mg by mouth daily.   Yes Historical Provider, MD  ferrous sulfate 325 (65 FE) MG tablet Take 325 mg by mouth daily with breakfast.   Yes Historical Provider, MD  metFORMIN (GLUCOPHAGE) 500 MG tablet Take 500 mg by mouth 2 (two) times daily with a meal.   Yes Historical Provider, MD  Multiple Vitamin (MULTIVITAMIN) tablet Take 1 tablet by mouth daily.   Yes Historical Provider, MD  pravastatin (PRAVACHOL) 20 MG tablet Take 20 mg by mouth at bedtime.   Yes Historical Provider, MD   BP 133/60 mmHg  Pulse 78  Temp(Src) 97.9 F (36.6 C) (Oral)  Resp 26  SpO2 100% Physical Exam  Constitutional: She is oriented to person, place, and time. She appears well-developed and well-nourished.  HENT:  Head: Normocephalic and atraumatic.  Eyes: EOM are normal. Pupils are equal, round, and reactive to light.  Neck: Normal range of motion. Neck supple.  Cardiovascular: Normal rate, regular rhythm and normal heart sounds.   No murmur heard. Pulmonary/Chest: Effort normal. No respiratory distress. She has no wheezes. She has no rales. She exhibits tenderness.  Tenderness to left lateral chest pain anteriorly. No crepitance or deformity. No rash.  Abdominal: Soft. Bowel sounds are normal. She exhibits no distension. There is no tenderness. There is no rebound and no guarding.  Musculoskeletal: Normal range of  motion.  Neurological: She is alert and oriented to person, place, and time. No cranial nerve deficit.  Skin: Skin is warm.  Psychiatric: She has a normal mood and affect. Her speech is normal.  Nursing note and vitals reviewed.   ED Course  Procedures (including critical care time) Labs Review Labs Reviewed  CBC - Abnormal; Notable for the following:    RBC 5.48 (*)    Hemoglobin 11.2 (*)    MCV 67.0 (*)    MCH  20.4 (*)    RDW 15.8 (*)    All other components within normal limits  BASIC METABOLIC PANEL - Abnormal; Notable for the following:    Glucose, Bld 169 (*)    GFR calc non Af Amer 70 (*)    GFR calc Af Amer 81 (*)    All other components within normal limits  I-STAT TROPOININ, ED  Randolm Idol, ED    Imaging Review Dg Chest 2 View  05/18/2014   CLINICAL DATA:  Heart palpitations.  EXAM: CHEST  2 VIEW  COMPARISON:  None.  FINDINGS: Normal mediastinum and cardiac silhouette. Normal pulmonary vasculature. No evidence of effusion, infiltrate, or pneumothorax. No acute bony abnormality.  IMPRESSION: No acute cardiopulmonary process.   Electronically Signed   By: Suzy Bouchard M.D.   On: 05/18/2014 19:19     EKG Interpretation   Date/Time:  Saturday May 18 2014 18:00:44 EST Ventricular Rate:  93 PR Interval:  130 QRS Duration: 91 QT Interval:  400 QTC Calculation: 497 R Axis:   4 Text Interpretation:  Sinus tachycardia Paired ventricular premature  complexes Consider inferior infarct Confirmed by Alvino Chapel  MD, Ovid Curd  [phone]) on 05/18/2014 6:11:42 PM      MDM   Final diagnoses:  Chest wall pain    Patient with chest pain. Reproducible. EKG reassuring. Doubt cardiac cause. Second troponin done but took extra time to be drawn. Patient's brother was somewhat upset by this. Will discharge home.    Jasper Riling. Alvino Chapel, MD 05/18/14 2150

## 2014-05-18 NOTE — ED Notes (Signed)
Pt. Reports having chest pain, center of chest , non-radiating, sharp pain, increases with palpation and inspiration.  Pt. Was sitting when pain started.  No other symptoms.   Skin is warm and dry.  Pt. Received  324 mg ASA and 1 Nitro , no change in pain. NSR on the monitor.

## 2015-12-23 ENCOUNTER — Other Ambulatory Visit: Payer: Self-pay | Admitting: Family Medicine

## 2015-12-23 DIAGNOSIS — Z1231 Encounter for screening mammogram for malignant neoplasm of breast: Secondary | ICD-10-CM

## 2016-01-13 ENCOUNTER — Ambulatory Visit
Admission: RE | Admit: 2016-01-13 | Discharge: 2016-01-13 | Disposition: A | Discharge status: Home / Self Care | Payer: PRIVATE HEALTH INSURANCE | Source: Ambulatory Visit | Attending: Family Medicine | Admitting: Family Medicine

## 2016-01-13 DIAGNOSIS — Z1231 Encounter for screening mammogram for malignant neoplasm of breast: Secondary | ICD-10-CM

## 2017-01-04 ENCOUNTER — Other Ambulatory Visit: Payer: Self-pay | Admitting: Family Medicine

## 2017-01-04 DIAGNOSIS — Z1231 Encounter for screening mammogram for malignant neoplasm of breast: Secondary | ICD-10-CM

## 2017-01-25 ENCOUNTER — Ambulatory Visit
Admission: RE | Admit: 2017-01-25 | Discharge: 2017-01-25 | Disposition: A | Discharge status: Home / Self Care | Payer: PRIVATE HEALTH INSURANCE | Source: Ambulatory Visit | Attending: Family Medicine | Admitting: Family Medicine

## 2017-01-25 DIAGNOSIS — Z1231 Encounter for screening mammogram for malignant neoplasm of breast: Secondary | ICD-10-CM

## 2017-07-22 ENCOUNTER — Other Ambulatory Visit: Payer: Self-pay | Admitting: Family Medicine

## 2017-07-22 DIAGNOSIS — Z1231 Encounter for screening mammogram for malignant neoplasm of breast: Secondary | ICD-10-CM

## 2018-01-26 ENCOUNTER — Ambulatory Visit
Admission: RE | Admit: 2018-01-26 | Discharge: 2018-01-26 | Disposition: A | Discharge status: Home / Self Care | Payer: PRIVATE HEALTH INSURANCE | Source: Ambulatory Visit | Attending: Family Medicine | Admitting: Family Medicine

## 2018-01-26 DIAGNOSIS — Z1231 Encounter for screening mammogram for malignant neoplasm of breast: Secondary | ICD-10-CM

## 2018-03-12 ENCOUNTER — Inpatient Hospital Stay (HOSPITAL_COMMUNITY)
Admission: EM | Admit: 2018-03-12 | Discharge: 2018-03-15 | DRG: 378 | Disposition: A | Discharge status: Home / Self Care | Payer: Medicare (Managed Care) | Attending: Family Medicine | Admitting: Family Medicine

## 2018-03-12 ENCOUNTER — Encounter (HOSPITAL_COMMUNITY): Payer: Self-pay | Admitting: *Deleted

## 2018-03-12 ENCOUNTER — Emergency Department (HOSPITAL_COMMUNITY): Payer: Medicare (Managed Care)

## 2018-03-12 ENCOUNTER — Other Ambulatory Visit: Payer: Self-pay

## 2018-03-12 DIAGNOSIS — Z7984 Long term (current) use of oral hypoglycemic drugs: Secondary | ICD-10-CM

## 2018-03-12 DIAGNOSIS — F0390 Unspecified dementia without behavioral disturbance: Secondary | ICD-10-CM | POA: Diagnosis present

## 2018-03-12 DIAGNOSIS — K921 Melena: Secondary | ICD-10-CM | POA: Diagnosis present

## 2018-03-12 DIAGNOSIS — K254 Chronic or unspecified gastric ulcer with hemorrhage: Secondary | ICD-10-CM | POA: Diagnosis not present

## 2018-03-12 DIAGNOSIS — E119 Type 2 diabetes mellitus without complications: Secondary | ICD-10-CM | POA: Diagnosis present

## 2018-03-12 DIAGNOSIS — K259 Gastric ulcer, unspecified as acute or chronic, without hemorrhage or perforation: Secondary | ICD-10-CM

## 2018-03-12 DIAGNOSIS — D500 Iron deficiency anemia secondary to blood loss (chronic): Secondary | ICD-10-CM | POA: Diagnosis present

## 2018-03-12 DIAGNOSIS — T39395A Adverse effect of other nonsteroidal anti-inflammatory drugs [NSAID], initial encounter: Secondary | ICD-10-CM | POA: Diagnosis present

## 2018-03-12 DIAGNOSIS — Z79899 Other long term (current) drug therapy: Secondary | ICD-10-CM

## 2018-03-12 DIAGNOSIS — D649 Anemia, unspecified: Secondary | ICD-10-CM | POA: Diagnosis present

## 2018-03-12 DIAGNOSIS — E785 Hyperlipidemia, unspecified: Secondary | ICD-10-CM | POA: Diagnosis present

## 2018-03-12 DIAGNOSIS — E872 Acidosis, unspecified: Secondary | ICD-10-CM

## 2018-03-12 DIAGNOSIS — Z8719 Personal history of other diseases of the digestive system: Secondary | ICD-10-CM

## 2018-03-12 DIAGNOSIS — E876 Hypokalemia: Secondary | ICD-10-CM | POA: Diagnosis not present

## 2018-03-12 DIAGNOSIS — Z7982 Long term (current) use of aspirin: Secondary | ICD-10-CM

## 2018-03-12 DIAGNOSIS — D638 Anemia in other chronic diseases classified elsewhere: Secondary | ICD-10-CM

## 2018-03-12 DIAGNOSIS — R0989 Other specified symptoms and signs involving the circulatory and respiratory systems: Secondary | ICD-10-CM

## 2018-03-12 DIAGNOSIS — E1169 Type 2 diabetes mellitus with other specified complication: Secondary | ICD-10-CM

## 2018-03-12 DIAGNOSIS — I959 Hypotension, unspecified: Secondary | ICD-10-CM | POA: Diagnosis present

## 2018-03-12 DIAGNOSIS — J45909 Unspecified asthma, uncomplicated: Secondary | ICD-10-CM | POA: Diagnosis present

## 2018-03-12 LAB — BLOOD GAS, VENOUS
ACID-BASE DEFICIT: 4.2 mmol/L — AB (ref 0.0–2.0)
Bicarbonate: 20.3 mmol/L (ref 20.0–28.0)
O2 SAT: 94.6 %
PCO2 VEN: 36.4 mmHg — AB (ref 44.0–60.0)
PH VEN: 7.365 (ref 7.250–7.430)
PO2 VEN: 79 mmHg — AB (ref 32.0–45.0)
Patient temperature: 98.6

## 2018-03-12 LAB — I-STAT CG4 LACTIC ACID, ED: LACTIC ACID, VENOUS: 5.57 mmol/L — AB (ref 0.5–1.9)

## 2018-03-12 LAB — CBG MONITORING, ED: GLUCOSE-CAPILLARY: 230 mg/dL — AB (ref 70–99)

## 2018-03-12 MED ORDER — SODIUM CHLORIDE 0.9 % IV BOLUS
1000.0000 mL | Freq: Once | INTRAVENOUS | Status: AC
  Administered 2018-03-13: 1000 mL via INTRAVENOUS

## 2018-03-12 MED ORDER — SODIUM CHLORIDE 0.9 % IV BOLUS
1000.0000 mL | Freq: Once | INTRAVENOUS | Status: AC
  Administered 2018-03-12: 1000 mL via INTRAVENOUS

## 2018-03-12 MED ORDER — ONDANSETRON HCL 4 MG/2ML IJ SOLN
4.0000 mg | Freq: Once | INTRAMUSCULAR | Status: AC
  Administered 2018-03-12: 4 mg via INTRAVENOUS
  Filled 2018-03-12: qty 2

## 2018-03-12 NOTE — ED Triage Notes (Signed)
Pt bib EMS and coming from home.  Pt reports generalized weakness for the past two days.  Pt has n/v x 2 days.  Pt unable to tolerate PO. Pt a/o x 4 and normally ambulatory.  Pt denied pain to EMS.

## 2018-03-12 NOTE — ED Notes (Signed)
Patient transported to X-ray 

## 2018-03-12 NOTE — ED Provider Notes (Addendum)
[place] DEPT Provider Note   CSN: [id number] Arrival date & time: 03/12/18  2206     History   Chief Complaint Chief Complaint  Patient presents with  . Weakness  . Emesis    HPI [name] is a 67 y.o. female.  Patient with a history of diabetes presenting with generalized weakness and nausea and vomiting.  She appears to be somewhat confused.  She is oriented to person and place but believes the year is 2008.  Unclear baseline.  She reports she is had vomiting for the past 3 weeks 2 or 3 times a day with elevated blood sugars in the 300 range.  States compliance with her medications.  Today EMS was called because she is been generally weak and unable to walk for the past 2 days.  She denies any fevers, chills or diarrhea.  Denies any pain.  No chest pain or shortness of breath.  No abdominal pain.  No pain with urination or blood in the urine.  No cough, runny nose or sore throat.  She denies any focal weakness but feels weak all over and normally does not walk with assistance but was apparently not able to walk today.  The history is provided by the patient.  Weakness  Primary symptoms include no dizziness. Associated symptoms include vomiting. Pertinent negatives include no shortness of breath and no chest pain.  Emesis   Pertinent negatives include no abdominal pain, no arthralgias, no cough, no diarrhea, no fever and no myalgias.    Past Medical History:  Diagnosis Date  . Anemia   . Cognitive impairment    lifelong  . Diabetes mellitus   . Hyperlipidemia     Patient Active Problem List   Diagnosis Date Noted  . OBESITY 12/05/2008  . VISUAL CHANGES 12/05/2008  . DENTAL CARIES 12/05/2008  . ASTHMA 12/03/2008  . WHEEZING 12/03/2008    Past Surgical History:  Procedure Laterality Date  . NO PAST SURGERIES       OB History   None      Home Medications    Prior to Admission medications   Medication Sig Start Date End  Date Taking? Authorizing Provider  aspirin EC 81 MG tablet Take 81 mg by mouth daily.    [provider]  ferrous sulfate 325 (65 FE) MG tablet Take 325 mg by mouth daily with breakfast.    [provider]  metFORMIN (GLUCOPHAGE) 500 MG tablet Take 500 mg by mouth 2 (two) times daily with a meal.    [provider]  Multiple Vitamin (MULTIVITAMIN) tablet Take 1 tablet by mouth daily.    [provider]  pravastatin (PRAVACHOL) 20 MG tablet Take 20 mg by mouth at bedtime.    [provider]    Family History Family History  Problem Relation Age of Onset  . Breast cancer Neg Hx     Social History Social History   Tobacco Use  . Smoking status: Never Smoker  . Smokeless tobacco: Never Used  Substance Use Topics  . Alcohol use: No    Alcohol/week: 0.0 standard drinks  . Drug use: No     Allergies   Patient has no known allergies.   Review of Systems Review of Systems  Constitutional: Positive for activity change, appetite change and fatigue. Negative for fever.  HENT: Negative for congestion and rhinorrhea.   Respiratory: Negative for cough, chest tightness and shortness of breath.   Cardiovascular: Negative for chest  pain.  Gastrointestinal: Positive for nausea and vomiting. Negative for abdominal pain and diarrhea.  Genitourinary: Negative for dysuria, enuresis, hematuria, menstrual problem, vaginal bleeding and vaginal discharge.  Musculoskeletal: Negative for arthralgias and myalgias.  Skin: Negative for rash.  Neurological: Positive for weakness. Negative for dizziness and light-headedness.    all other systems are negative except as noted in the HPI and PMH.    Physical Exam Updated Vital Signs BP (!) 133/49 (BP Location: Right Arm)   Pulse 88   Temp 98.4 F (36.9 C) (Oral)   Resp (!) 28   Ht 5\' 3"  (1.6 m)   Wt 74.4 kg   SpO2 100%   BMI 29.05 kg/m   Physical Exam  Constitutional: She appears well-developed and  well-nourished. No distress.  HENT:  Head: Normocephalic and atraumatic.  Mouth/Throat: Oropharynx is clear and moist. No oropharyngeal exudate.  Dry mucus membranes  Eyes: Pupils are equal, round, and reactive to light. Conjunctivae and EOM are normal.  Pale conjunctiva  Neck: Normal range of motion. Neck supple.  No meningismus.  Cardiovascular: Normal rate, regular rhythm, normal heart sounds and intact distal pulses.  No murmur heard. Pulmonary/Chest: Effort normal and breath sounds normal. No respiratory distress. She exhibits no tenderness.  Abdominal: Soft. There is no tenderness. There is no rebound and no guarding.  Genitourinary:  Genitourinary Comments: External hemorrhoids,  Dark stool on digital exam Chaperone present  Musculoskeletal: Normal range of motion. She exhibits no edema or tenderness.  Neurological: She is alert. No cranial nerve deficit. She exhibits normal muscle tone. Coordination normal.   5/5 strength throughout. CN 2-12 intact.Equal grip strength.   Oriented to person and place.  Believes the year is 2008.  Skin: Skin is warm. Capillary refill takes less than 2 seconds. No rash noted.  Psychiatric: She has a normal mood and affect. Her behavior is normal.  Nursing note and vitals reviewed.    ED Treatments / Results  Labs (all labs ordered are listed, but only abnormal results are displayed) Labs Reviewed  CBC WITH DIFFERENTIAL/PLATELET - Abnormal; Notable for the following components:      Result Value   RBC 2.97 (*)    Hemoglobin 4.1 (*)    HCT 16.5 (*)    MCV 55.6 (*)    MCH 13.8 (*)    MCHC 24.8 (*)    RDW 19.8 (*)    nRBC 0.6 (*)    All other components within normal limits  COMPREHENSIVE METABOLIC PANEL - Abnormal; Notable for the following components:   Glucose, Bld 228 (*)    Calcium 8.6 (*)    Albumin 3.0 (*)    All other components within normal limits  BLOOD GAS, VENOUS - Abnormal; Notable for the following components:   pCO2,  Ven 36.4 (*)    pO2, Ven 79.0 (*)    Acid-base deficit 4.2 (*)    All other components within normal limits  AMMONIA - Abnormal; Notable for the following components:   Ammonia <9 (*)    All other components within normal limits  CBG MONITORING, ED - Abnormal; Notable for the following components:   Glucose-Capillary 230 (*)    All other components within normal limits  I-STAT CG4 LACTIC ACID, ED - Abnormal; Notable for the following components:   Lactic Acid, Venous 5.57 (*)    All other components within normal limits  POC OCCULT BLOOD, ED - Abnormal; Notable for the following components:   Fecal Occult Bld POSITIVE (*)  All other components within normal limits  I-STAT CG4 LACTIC ACID, ED - Abnormal; Notable for the following components:   Lactic Acid, Venous 3.75 (*)    All other components within normal limits  LIPASE, BLOOD  TROPONIN I  PROTIME-INR  URINALYSIS, ROUTINE W REFLEX MICROSCOPIC  TYPE AND SCREEN  PREPARE RBC (CROSSMATCH)  ABO/RH    EKG EKG Interpretation  Date/Time:  Sunday March 12 2018 23:26:11 EST Ventricular Rate:  87 PR Interval:    QRS Duration: 95 QT Interval:  424 QTC Calculation: 511 R Axis:   35 Text Interpretation:  Sinus rhythm Borderline T wave abnormalities Prolonged QT interval No significant change was found Confirmed by Ezequiel Essex [phone]) on 03/12/2018 11:43:07 PM   Radiology Dg Chest 2 View  Result Date: 03/12/2018 CLINICAL DATA:  Generalized weakness. EXAM: CHEST - 2 VIEW COMPARISON:  May 18, 2014 FINDINGS: The heart size and mediastinal contours are within normal limits. Both lungs are clear. The visualized skeletal structures are unremarkable. IMPRESSION: No active cardiopulmonary disease. Electronically Signed   By: Dorise Bullion III M.D   On: 03/12/2018 23:50   Ct Head Wo Contrast  Result Date: 03/13/2018 CLINICAL DATA:  Altered mental status EXAM: CT HEAD WITHOUT CONTRAST TECHNIQUE: Contiguous axial images were  obtained from the base of the skull through the vertex without intravenous contrast. COMPARISON:  01/06/2011 FINDINGS: Brain: There is no mass, hemorrhage or extra-axial collection. The size and configuration of the ventricles and extra-axial CSF spaces are normal. The brain parenchyma is normal, without evidence of acute or chronic infarction. Vascular: No abnormal hyperdensity of the major intracranial arteries or dural venous sinuses. No intracranial atherosclerosis. Skull: The visualized skull base, calvarium and extracranial soft tissues are normal. Sinuses/Orbits: No fluid levels or advanced mucosal thickening of the visualized paranasal sinuses. No mastoid or middle ear effusion. The orbits are normal. IMPRESSION: Normal head CT. Electronically Signed   By: Ulyses Jarred M.D.   On: 03/13/2018 00:48    Procedures Procedures (including critical care time)  Medications Ordered in ED Medications  sodium chloride 0.9 % bolus 1,000 mL (has no administration in time range)  ondansetron (ZOFRAN) injection 4 mg (has no administration in time range)     Initial Impression / Assessment and Plan / ED Course  I have reviewed the triage vital signs and the nursing notes.  Pertinent labs & imaging results that were available during my care of the patient were reviewed by me and considered in my medical decision making (see chart for details).    Diabetic with generalized weakness, nausea and vomiting somewhat confused.  Denies pain.  CBG 230.  Lactic acid elevated at 5.5.  Anion gap is normal.  pH is normal.  There is no evidence of DKA.  Patient to have found to have hemoglobin of 4.1.  She is Hemoccult positive but denies any recent vaginal bleeding.  She is agreeable to blood transfusion.  Patient started on blood transfusion.  Started PPI infusion.  She is agreeable to blood transfusion.  Remains hemodynamic is stable.  She is given IV fluids and PRBCs.  Her lactic acid is elevated but anion  gap is normal.  She does not appear to be in DKA.  Admission discussed with internal medicine residents who will arrange for admission at [facility].  Addendum: Stepdown bed at [facility] delayed. Admission to [facility] d/w Dr. Lonny Prude who will admit and requests GI consult.   CRITICAL CARE Performed by: Ezequiel Essex Total critical care time: 9  minutes Critical care time was exclusive of separately billable procedures and treating other patients. Critical care was necessary to treat or prevent imminent or life-threatening deterioration. Critical care was time spent personally by me on the following activities: development of treatment plan with patient and/or surrogate as well as nursing, discussions with consultants, evaluation of patient's response to treatment, examination of patient, obtaining history from patient or surrogate, ordering and performing treatments and interventions, ordering and review of laboratory studies, ordering and review of radiographic studies, pulse oximetry and re-evaluation of patient's condition.  Final Clinical Impressions(s) / ED Diagnoses   Final diagnoses:  Melena  Lactic acidosis    ED Discharge Orders    None       Tajanay Hurley, Annie Main, MD 03/13/18 0730    Ezequiel Essex, MD 03/13/18 0900

## 2018-03-12 NOTE — ED Notes (Signed)
Bed: OC35 Expected date:  Expected time:  Means of arrival:  Comments: 52F Hyperglycemia

## 2018-03-13 ENCOUNTER — Inpatient Hospital Stay (HOSPITAL_COMMUNITY): Payer: Medicare (Managed Care)

## 2018-03-13 ENCOUNTER — Encounter (HOSPITAL_COMMUNITY): Payer: Self-pay | Admitting: Family Medicine

## 2018-03-13 ENCOUNTER — Emergency Department (HOSPITAL_COMMUNITY): Payer: Medicare (Managed Care)

## 2018-03-13 DIAGNOSIS — I5030 Unspecified diastolic (congestive) heart failure: Secondary | ICD-10-CM | POA: Diagnosis not present

## 2018-03-13 DIAGNOSIS — D500 Iron deficiency anemia secondary to blood loss (chronic): Secondary | ICD-10-CM | POA: Diagnosis not present

## 2018-03-13 DIAGNOSIS — I959 Hypotension, unspecified: Secondary | ICD-10-CM | POA: Diagnosis present

## 2018-03-13 DIAGNOSIS — R0989 Other specified symptoms and signs involving the circulatory and respiratory systems: Secondary | ICD-10-CM

## 2018-03-13 DIAGNOSIS — E872 Acidosis: Secondary | ICD-10-CM | POA: Diagnosis present

## 2018-03-13 DIAGNOSIS — K921 Melena: Secondary | ICD-10-CM | POA: Diagnosis present

## 2018-03-13 DIAGNOSIS — R4189 Other symptoms and signs involving cognitive functions and awareness: Secondary | ICD-10-CM | POA: Diagnosis not present

## 2018-03-13 DIAGNOSIS — E109 Type 1 diabetes mellitus without complications: Secondary | ICD-10-CM | POA: Diagnosis not present

## 2018-03-13 DIAGNOSIS — T39395A Adverse effect of other nonsteroidal anti-inflammatory drugs [NSAID], initial encounter: Secondary | ICD-10-CM | POA: Diagnosis present

## 2018-03-13 DIAGNOSIS — F0390 Unspecified dementia without behavioral disturbance: Secondary | ICD-10-CM | POA: Diagnosis present

## 2018-03-13 DIAGNOSIS — Z7984 Long term (current) use of oral hypoglycemic drugs: Secondary | ICD-10-CM | POA: Diagnosis not present

## 2018-03-13 DIAGNOSIS — Z8719 Personal history of other diseases of the digestive system: Secondary | ICD-10-CM | POA: Diagnosis not present

## 2018-03-13 DIAGNOSIS — Z79899 Other long term (current) drug therapy: Secondary | ICD-10-CM | POA: Diagnosis not present

## 2018-03-13 DIAGNOSIS — K259 Gastric ulcer, unspecified as acute or chronic, without hemorrhage or perforation: Secondary | ICD-10-CM | POA: Diagnosis not present

## 2018-03-13 DIAGNOSIS — D649 Anemia, unspecified: Secondary | ICD-10-CM | POA: Diagnosis not present

## 2018-03-13 DIAGNOSIS — Z7982 Long term (current) use of aspirin: Secondary | ICD-10-CM | POA: Diagnosis not present

## 2018-03-13 DIAGNOSIS — D508 Other iron deficiency anemias: Secondary | ICD-10-CM | POA: Diagnosis not present

## 2018-03-13 DIAGNOSIS — J45909 Unspecified asthma, uncomplicated: Secondary | ICD-10-CM | POA: Diagnosis present

## 2018-03-13 DIAGNOSIS — E876 Hypokalemia: Secondary | ICD-10-CM | POA: Diagnosis not present

## 2018-03-13 DIAGNOSIS — K254 Chronic or unspecified gastric ulcer with hemorrhage: Secondary | ICD-10-CM | POA: Diagnosis present

## 2018-03-13 DIAGNOSIS — E785 Hyperlipidemia, unspecified: Secondary | ICD-10-CM

## 2018-03-13 DIAGNOSIS — E119 Type 2 diabetes mellitus without complications: Secondary | ICD-10-CM | POA: Diagnosis present

## 2018-03-13 DIAGNOSIS — K222 Esophageal obstruction: Secondary | ICD-10-CM | POA: Diagnosis not present

## 2018-03-13 DIAGNOSIS — R195 Other fecal abnormalities: Secondary | ICD-10-CM | POA: Diagnosis not present

## 2018-03-13 LAB — CBC WITH DIFFERENTIAL/PLATELET
ABS IMMATURE GRANULOCYTES: 0.07 10*3/uL (ref 0.00–0.07)
BASOS PCT: 1 %
Basophils Absolute: 0 10*3/uL (ref 0.0–0.1)
Eosinophils Absolute: 0 10*3/uL (ref 0.0–0.5)
Eosinophils Relative: 0 %
HCT: 16.5 % — ABNORMAL LOW (ref 36.0–46.0)
Hemoglobin: 4.1 g/dL — CL (ref 12.0–15.0)
IMMATURE GRANULOCYTES: 1 %
LYMPHS PCT: 12 %
Lymphs Abs: 1 10*3/uL (ref 0.7–4.0)
MCH: 13.8 pg — ABNORMAL LOW (ref 26.0–34.0)
MCHC: 24.8 g/dL — ABNORMAL LOW (ref 30.0–36.0)
MCV: 55.6 fL — AB (ref 80.0–100.0)
MONO ABS: 0.5 10*3/uL (ref 0.1–1.0)
Monocytes Relative: 6 %
NEUTROS ABS: 7.2 10*3/uL (ref 1.7–7.7)
Neutrophils Relative %: 80 %
PLATELETS: 347 10*3/uL (ref 150–400)
RBC: 2.97 MIL/uL — ABNORMAL LOW (ref 3.87–5.11)
RDW: 19.8 % — AB (ref 11.5–15.5)
WBC: 8.8 10*3/uL (ref 4.0–10.5)
nRBC: 0.6 % — ABNORMAL HIGH (ref 0.0–0.2)

## 2018-03-13 LAB — COMPREHENSIVE METABOLIC PANEL
ALBUMIN: 3 g/dL — AB (ref 3.5–5.0)
ALT: 10 U/L (ref 0–44)
AST: 23 U/L (ref 15–41)
Alkaline Phosphatase: 56 U/L (ref 38–126)
Anion gap: 11 (ref 5–15)
BUN: 18 mg/dL (ref 8–23)
CHLORIDE: 104 mmol/L (ref 98–111)
CO2: 22 mmol/L (ref 22–32)
CREATININE: 0.81 mg/dL (ref 0.44–1.00)
Calcium: 8.6 mg/dL — ABNORMAL LOW (ref 8.9–10.3)
GFR calc Af Amer: >60 mL/min (ref >60)
GFR calc non Af Amer: >60 mL/min (ref >60)
Glucose, Bld: 228 mg/dL — ABNORMAL HIGH (ref 70–99)
Potassium: 3.8 mmol/L (ref 3.5–5.1)
Sodium: 137 mmol/L (ref 135–145)
Total Bilirubin: 0.6 mg/dL (ref 0.3–1.2)
Total Protein: 6.6 g/dL (ref 6.5–8.1)

## 2018-03-13 LAB — RETICULOCYTES
IMMATURE RETIC FRACT: 25.7 % — AB (ref 2.3–15.9)
RBC.: 3.84 MIL/uL — AB (ref 3.87–5.11)
RETIC COUNT ABSOLUTE: 5.8 10*3/uL — AB (ref 19.0–186.0)
RETIC CT PCT: 0.2 % — AB (ref 0.4–3.1)

## 2018-03-13 LAB — CBC
HCT: 25.5 % — ABNORMAL LOW (ref 36.0–46.0)
HEMOGLOBIN: 7.2 g/dL — AB (ref 12.0–15.0)
MCH: 18.8 pg — ABNORMAL LOW (ref 26.0–34.0)
MCHC: 28.2 g/dL — AB (ref 30.0–36.0)
MCV: 66.4 fL — ABNORMAL LOW (ref 80.0–100.0)
NRBC: 0.5 % — AB (ref 0.0–0.2)
Platelets: 291 10*3/uL (ref 150–400)
RBC: 3.84 MIL/uL — ABNORMAL LOW (ref 3.87–5.11)
RDW: 31.6 % — ABNORMAL HIGH (ref 11.5–15.5)
WBC: 9.7 10*3/uL (ref 4.0–10.5)

## 2018-03-13 LAB — I-STAT CG4 LACTIC ACID, ED: Lactic Acid, Venous: 3.75 mmol/L (ref 0.5–1.9)

## 2018-03-13 LAB — ABO/RH: ABO/RH(D): A POS

## 2018-03-13 LAB — PROTIME-INR
INR: 1
PROTHROMBIN TIME: 13.1 s (ref 11.4–15.2)

## 2018-03-13 LAB — IRON AND TIBC
Iron: 24 ug/dL — ABNORMAL LOW (ref 28–170)
Saturation Ratios: 5 % — ABNORMAL LOW (ref 10.4–31.8)
TIBC: 510 ug/dL — ABNORMAL HIGH (ref 250–450)
UIBC: 486 ug/dL

## 2018-03-13 LAB — LIPASE, BLOOD: Lipase: 23 U/L (ref 11–51)

## 2018-03-13 LAB — MRSA PCR SCREENING: MRSA by PCR: NEGATIVE

## 2018-03-13 LAB — AMMONIA: Ammonia: <9 umol/L — ABNORMAL LOW (ref 9–35)

## 2018-03-13 LAB — GLUCOSE, CAPILLARY
GLUCOSE-CAPILLARY: 105 mg/dL — AB (ref 70–99)
GLUCOSE-CAPILLARY: 147 mg/dL — AB (ref 70–99)
Glucose-Capillary: 153 mg/dL — ABNORMAL HIGH (ref 70–99)

## 2018-03-13 LAB — FOLATE: Folate: 11.7 ng/mL

## 2018-03-13 LAB — ECHOCARDIOGRAM COMPLETE
Height: 63 in
Weight: 2610.25 [oz_av]

## 2018-03-13 LAB — PREPARE RBC (CROSSMATCH)

## 2018-03-13 LAB — POC OCCULT BLOOD, ED: Fecal Occult Bld: POSITIVE — AB

## 2018-03-13 LAB — VITAMIN B12: Vitamin B-12: 140 pg/mL — ABNORMAL LOW (ref 180–914)

## 2018-03-13 LAB — FERRITIN: Ferritin: 2 ng/mL — ABNORMAL LOW (ref 11–307)

## 2018-03-13 LAB — LACTIC ACID, PLASMA: LACTIC ACID, VENOUS: 1.4 mmol/L (ref 0.5–1.9)

## 2018-03-13 LAB — TROPONIN I: Troponin I: <0.03 ng/mL (ref <0.03)

## 2018-03-13 MED ORDER — SODIUM CHLORIDE 0.9 % IV SOLN
INTRAVENOUS | Status: DC
  Administered 2018-03-13: 09:00:00 via INTRAVENOUS

## 2018-03-13 MED ORDER — ACETAMINOPHEN 650 MG RE SUPP: 650.0000 mg | Freq: Four times a day (QID) | RECTAL | Status: DC | PRN

## 2018-03-13 MED ORDER — PROMETHAZINE HCL 25 MG/ML IJ SOLN: 12.5000 mg | Freq: Once | INTRAMUSCULAR | Status: DC

## 2018-03-13 MED ORDER — ONDANSETRON HCL 4 MG/2ML IJ SOLN: 4.0000 mg | Freq: Once | INTRAMUSCULAR | Status: DC

## 2018-03-13 MED ORDER — INSULIN ASPART 100 UNIT/ML ~~LOC~~ SOLN
0.0000 [IU] | Freq: Three times a day (TID) | SUBCUTANEOUS | Status: DC
  Administered 2018-03-14: 2 [IU] via SUBCUTANEOUS
  Administered 2018-03-15: 1 [IU] via SUBCUTANEOUS

## 2018-03-13 MED ORDER — ONDANSETRON HCL 4 MG/2ML IJ SOLN: 4.0000 mg | Freq: Four times a day (QID) | INTRAMUSCULAR | Status: DC | PRN

## 2018-03-13 MED ORDER — SODIUM CHLORIDE 0.9% IV SOLUTION: Freq: Once | INTRAVENOUS | Status: DC

## 2018-03-13 MED ORDER — SODIUM CHLORIDE 0.9 % IV SOLN
8.0000 mg/h | INTRAVENOUS | Status: DC
  Administered 2018-03-13: 8 mg/h via INTRAVENOUS
  Filled 2018-03-13 (×3): qty 80

## 2018-03-13 MED ORDER — ACETAMINOPHEN 325 MG PO TABS: 650.0000 mg | ORAL_TABLET | Freq: Four times a day (QID) | ORAL | Status: DC | PRN

## 2018-03-13 MED ORDER — PRAVASTATIN SODIUM 20 MG PO TABS
20.0000 mg | ORAL_TABLET | Freq: Every day | ORAL | Status: DC
  Administered 2018-03-14 – 2018-03-15 (×2): 20 mg via ORAL
  Filled 2018-03-13 (×2): qty 1

## 2018-03-13 MED ORDER — PANTOPRAZOLE SODIUM 40 MG PO TBEC
40.0000 mg | DELAYED_RELEASE_TABLET | Freq: Every day | ORAL | Status: DC
  Administered 2018-03-14: 40 mg via ORAL
  Filled 2018-03-13: qty 1

## 2018-03-13 MED ORDER — ONDANSETRON HCL 4 MG PO TABS: 4.0000 mg | ORAL_TABLET | Freq: Four times a day (QID) | ORAL | Status: DC | PRN

## 2018-03-13 MED ORDER — SODIUM CHLORIDE 0.9 % IV SOLN
80.0000 mg | Freq: Once | INTRAVENOUS | Status: AC
  Administered 2018-03-13: 80 mg via INTRAVENOUS
  Filled 2018-03-13: qty 80

## 2018-03-13 MED ORDER — SODIUM CHLORIDE 0.9% IV SOLUTION
Freq: Once | INTRAVENOUS | Status: AC
  Administered 2018-03-14: 12:00:00 via INTRAVENOUS

## 2018-03-13 MED ORDER — PROMETHAZINE HCL 25 MG/ML IJ SOLN
12.5000 mg | Freq: Once | INTRAMUSCULAR | Status: AC
  Administered 2018-03-13: 12.5 mg via INTRAVENOUS
  Filled 2018-03-13: qty 1

## 2018-03-13 NOTE — Progress Notes (Signed)
Patient is a 67 yo [facility] patient who presented with generalized weakness, N/V found to have severe lactic acidosis and hemoglobin of 4.1. Admission was discussed with our night float team. Unfortunately there are no stepdown beds available at [facility] and transfer has been delayed. She will be admitted to triad service in the meantime. We appreciate their assistance. We are happy to accept patient once stable for transfer and a bed becomes available.   Velna Ochs, M.D. - PGY3 Pager: [phone] 03/13/2018, 9:14 AM

## 2018-03-13 NOTE — ED Notes (Signed)
Bed: WA09 Expected date:  Expected time:  Means of arrival:  Comments: 

## 2018-03-13 NOTE — ED Notes (Signed)
Patient in xray 

## 2018-03-13 NOTE — H&P (Addendum)
History and Physical    [name] LFY:[id number] DOB: [birth year] DOA: 03/12/2018  PCP: Janifer Adie, MD Patient coming from: Home  Chief Complaint: Low blood sugar  HPI: [name] is a 67 y.o. female with medical history significant of cognitive impairment, anemia, diabetes, hyperlipidemia. Patient is a poor historian secondary to mental status. Patient states she had low blood sugar and called EMS for transfer to the ED. She reports some lightheadedness. Per chart review, patient had generalized weakness, nausea and vomiting with symptoms starting about 2 days ago.  ED Course: Vitals: Afebrile, normal pulse, slight tachypnea with respirations in mid 20s, soft blood pressure with last BP of 107/50, on room air Labs: Glucose of 228, albumin of 3, lactic acid of 5.57>>3.75, hemoglobin of 4.1, MCV of 55.6 Imaging: CT head normal, chest x-ray without cardiomegaly or active pulmonary disease Medications/Course: 2 units PRBC, 2L IV fluid bolus and IV fluids @100ml /hr, phenergan, zofran  Review of Systems: Review of Systems  Constitutional: Positive for malaise/fatigue. Negative for chills and fever.  Respiratory: Negative for cough and shortness of breath.   Cardiovascular: Negative for chest pain and palpitations.  Gastrointestinal: Negative for abdominal pain, blood in stool, constipation, diarrhea, melena, nausea and vomiting.  All other systems reviewed and are negative.   Past Medical History:  Diagnosis Date  . Anemia   . Cognitive impairment    lifelong  . Diabetes mellitus   . Hyperlipidemia     Past Surgical History:  Procedure Laterality Date  . NO PAST SURGERIES       reports that she has never smoked. She has never used smokeless tobacco. She reports that she does not drink alcohol or use drugs.  No Known Allergies  Family History  Problem Relation Age of Onset  . Breast cancer Neg Hx     Prior to Admission medications   Medication Sig Start  Date End Date Taking? Authorizing Provider  aspirin EC 81 MG tablet Take 81 mg by mouth daily.   Yes [provider]  Cholecalciferol (VITAMIN D3) 1000 units CAPS Take 1 capsule by mouth daily.   Yes [provider]  metFORMIN (GLUCOPHAGE) 1000 MG tablet Take 1,000 mg by mouth 2 (two) times daily with a meal.   Yes [provider]  pravastatin (PRAVACHOL) 20 MG tablet Take 20 mg by mouth daily.   Yes [provider]    Physical Exam:  Physical Exam  Constitutional: She appears well-developed and well-nourished. No distress.  HENT:  Mouth/Throat: Oropharynx is clear and moist.  Eyes: Pupils are equal, round, and reactive to light. Conjunctivae and EOM are normal.  Neck: Normal range of motion.  Cardiovascular: Normal rate and regular rhythm.  Murmur heard.  Systolic murmur is present with a grade of 2/6. Pulmonary/Chest: Effort normal. No accessory muscle usage. Tachypnea noted. No respiratory distress. She has no wheezes. She has no rhonchi. She has rales.  Abdominal: Soft. Bowel sounds are normal. She exhibits no distension. There is no tenderness. There is no rebound and no guarding.  Musculoskeletal: Normal range of motion. She exhibits no edema or tenderness.  Lymphadenopathy:    She has no cervical adenopathy.  Neurological: She is alert. She has normal strength. She is disoriented (oriented to person and place).  Skin: Skin is warm and dry. She is not diaphoretic.  Psychiatric: She has a normal mood and affect. Her speech is normal. Cognition and memory are impaired.     Labs on Admission: I  have personally reviewed following labs and imaging studies  CBC: Recent Labs  Lab 03/12/18 2320  WBC 8.8  NEUTROABS 7.2  HGB 4.1*  HCT 16.5*  MCV 55.6*  PLT 161    Basic Metabolic Panel: Recent Labs  Lab 03/12/18 2320  NA 137  K 3.8  CL 104  CO2 22  GLUCOSE 228*  BUN 18  CREATININE 0.81  CALCIUM 8.6*    GFR: Estimated Creatinine  Clearance: 66 mL/min (by C-G formula based on SCr of 0.81 mg/dL).  Liver Function Tests: Recent Labs  Lab 03/12/18 2320  AST 23  ALT 10  ALKPHOS 56  BILITOT 0.6  PROT 6.6  ALBUMIN 3.0*   Recent Labs  Lab 03/12/18 2320  LIPASE 23   Recent Labs  Lab 03/12/18 2321  AMMONIA <9*    Coagulation Profile: Recent Labs  Lab 03/12/18 2354  INR 1.00    Cardiac Enzymes: Recent Labs  Lab 03/12/18 2320  TROPONINI <0.03    BNP (last 3 results) No results for input(s): PROBNP in the last 8760 hours.  HbA1C: No results for input(s): HGBA1C in the last 72 hours.  CBG: Recent Labs  Lab 03/12/18 2231  GLUCAP 230*    Lipid Profile: No results for input(s): CHOL, HDL, LDLCALC, TRIG, CHOLHDL, LDLDIRECT in the last 72 hours.  Thyroid Function Tests: No results for input(s): TSH, T4TOTAL, FREET4, T3FREE, THYROIDAB in the last 72 hours.  Anemia Panel: No results for input(s): VITAMINB12, FOLATE, FERRITIN, TIBC, IRON, RETICCTPCT in the last 72 hours.  Urine analysis: No results found for: COLORURINE, APPEARANCEUR, LABSPEC, [place], GLUCOSEU, [place], BILIRUBINUR, KETONESUR, PROTEINUR, UROBILINOGEN, NITRITE, LEUKOCYTESUR   Radiological Exams on Admission: Dg Chest 2 View  Result Date: 03/12/2018 CLINICAL DATA:  Generalized weakness. EXAM: CHEST - 2 VIEW COMPARISON:  May 18, 2014 FINDINGS: The heart size and mediastinal contours are within normal limits. Both lungs are clear. The visualized skeletal structures are unremarkable. IMPRESSION: No active cardiopulmonary disease. Electronically Signed   By: Dorise Bullion III M.D   On: 03/12/2018 23:50   Ct Head Wo Contrast  Result Date: 03/13/2018 CLINICAL DATA:  Altered mental status EXAM: CT HEAD WITHOUT CONTRAST TECHNIQUE: Contiguous axial images were obtained from the base of the skull through the vertex without intravenous contrast. COMPARISON:  01/06/2011 FINDINGS: Brain: There is no mass, hemorrhage or extra-axial  collection. The size and configuration of the ventricles and extra-axial CSF spaces are normal. The brain parenchyma is normal, without evidence of acute or chronic infarction. Vascular: No abnormal hyperdensity of the major intracranial arteries or dural venous sinuses. No intracranial atherosclerosis. Skull: The visualized skull base, calvarium and extracranial soft tissues are normal. Sinuses/Orbits: No fluid levels or advanced mucosal thickening of the visualized paranasal sinuses. No mastoid or middle ear effusion. The orbits are normal. IMPRESSION: Normal head CT. Electronically Signed   By: Ulyses Jarred M.D.   On: 03/13/2018 00:48    EKG: Independently reviewed. NSR, prolonged qtc  Assessment/Plan Active Problems:   Anemia   Melena   Symptomatic anemia   Symptomatic anemia Secondary to presumed GI bleeding with positive FOBT. Patient has seen [facility] in the past and had a colonoscopy in 2015 significant for hyperplastic polyps. S/p 2 units PRBC. Started on protonix drip. BUN is normal. -Clear liquid diet -Asked ED to consult [facility] -Post-transfusion CBC and likely will give 1 more unit of PRBC  Hypotension Mild. Improved slightly -Will likely improve with blood -Hold NS IV fluids as patient has  some tachypnea with rales on exam -Repeat lactic is pending  Melena Patient cannot give me a history of melenotic or bloody stools. FOBT positive. -Plan above  Rales In setting of aggressive IV hydration and blood transfusions. Associated tachypnea without hypoxia. No cardiomegaly on chest x-ray, however, patient has a loud murmur; unsure of acuity. -Chest x-ray -Transthoracic Echocardiogram  Cognitive impairment Unsure of patient's baseline. Appears to have some deficits with memory. Patient is a [facility] patient. No evidence of infection (urinalysis pending) and CT head unremarkable.  Diabetes mellitus, type 2 Patient is on metformin as an outpatient. -Hold metformin -SSI  qAC  Hyperlipidemia -Continue pravastatin   DVT prophylaxis: SCDs Code Status: Full code Family Communication: None at bedside Disposition Plan: Stepdown Consults called: Gastroenterology Admission status: Inpatient   Cordelia Poche, MD [facility] 03/13/2018, 9:05 AM  If 7PM-7AM, please contact night-coverage [email or web address] Password [email or web address]

## 2018-03-13 NOTE — Consult Note (Signed)
Consultation  Referring Provider: Dr. Karena Addison hospitalist  primary Care Physician:  Janifer Adie, MD Primary Gastroenterologist:  Dr. Donnamarie Poag  Reason for Consultation: Profound microcytic anemia, heme positive stool  HPI: [name] is a 67 y.o. female, who was admitted through the emergency room late last evening after she presented with weakness, nausea and concerns about a low blood sugar.  She was apparently too weak to walk over the past 2 days and called EMS. Work-up in the emergency room with elevated lactic acid at 5.5, anion gap was normal and there was no evidence of DKA.  She was found to have a hemoglobin of 4.1, MCV of 55.  She has been transfused 2 units of packed RBCs, and follow-up hemoglobin is pending. Patient is felt to have some cognitive impairment.  CT of the head was done and was unremarkable. Recent hemoglobins for comparison, apparently in 2016 hemoglobin was 11.2 hematocrit of 36.7. Patient takes baby aspirin daily, no chronic PPI. She was seen by Dr. Deatra Ina in 2015 and underwent colonoscopy for screening in December 2015.  Noted to have 2 small polyps which were removed and found to be hyperplastic, internal hemorrhoids, and had a poor prep with incomplete exam.  Follow-up was recommended in 1 to 3 years, 5 years if polyps hyperplastic. No prior EGD. Patient is not a great historian, very pleasant.  She denies any NSAID use, says she only takes the medications that [facility] brings her.  She is on baby aspirin once daily She has no prior history of GI bleeding or severe anemia that she is aware of.  She denies any heartburn or indigestion, no dysphagia or odynophagia.  She is not on any stomach medication at home.  She denies any recent abdominal pain, appetite has been fine, she is not aware of any significant weight loss.  Bowel movements have been regular denies any significant diarrhea or constipation no melena or hematochezia. She says she had  developed worsening weakness over the past several weeks.   Past Medical History:  Diagnosis Date  . Anemia   . Cognitive impairment    lifelong  . Diabetes mellitus   . Hyperlipidemia     Past Surgical History:  Procedure Laterality Date  . NO PAST SURGERIES      Prior to Admission medications   Medication Sig Start Date End Date Taking? Authorizing Provider  aspirin EC 81 MG tablet Take 81 mg by mouth daily.   Yes [provider]  Cholecalciferol (VITAMIN D3) 1000 units CAPS Take 1 capsule by mouth daily.   Yes [provider]  metFORMIN (GLUCOPHAGE) 1000 MG tablet Take 1,000 mg by mouth 2 (two) times daily with a meal.   Yes [provider]  pravastatin (PRAVACHOL) 20 MG tablet Take 20 mg by mouth daily.   Yes [provider]    Current Facility-Administered Medications  Medication Dose Route Frequency Provider Last Rate Last Dose  . 0.9 %  sodium chloride infusion (Manually program via Guardrails IV Fluids)   Intravenous Once Mariel Aloe, MD      . acetaminophen (TYLENOL) tablet 650 mg  650 mg Oral Q6H PRN Mariel Aloe, MD       Or  . acetaminophen (TYLENOL) suppository 650 mg  650 mg Rectal Q6H PRN Mariel Aloe, MD      . insulin aspart (novoLOG) injection 0-9 Units  0-9 Units Subcutaneous TID WC Mariel Aloe, MD      .  ondansetron (ZOFRAN) tablet 4 mg  4 mg Oral Q6H PRN Mariel Aloe, MD       Or  . ondansetron (ZOFRAN) injection 4 mg  4 mg Intravenous Q6H PRN Mariel Aloe, MD      . pantoprazole (PROTONIX) 80 mg in sodium chloride 0.9 % 250 mL (0.32 mg/mL) infusion  8 mg/hr Intravenous Continuous Mariel Aloe, MD 25 mL/hr at 03/13/18 0121 8 mg/hr at 03/13/18 0121  . pravastatin (PRAVACHOL) tablet 20 mg  20 mg Oral Daily Mariel Aloe, MD        Allergies as of 03/12/2018  . (No Known Allergies)    Family History  Problem Relation Age of Onset  . Breast cancer Neg Hx     Social History   Socioeconomic  History  . Marital status: Single    Spouse name: Not on file  . Number of children: Not on file  . Years of education: Not on file  . Highest education level: Not on file  Occupational History  . Not on file  Social Needs  . Financial resource strain: Not on file  . Food insecurity:    Worry: Not on file    Inability: Not on file  . Transportation needs:    Medical: Not on file    Non-medical: Not on file  Tobacco Use  . Smoking status: Never Smoker  . Smokeless tobacco: Never Used  Substance and Sexual Activity  . Alcohol use: No    Alcohol/week: 0.0 standard drinks  . Drug use: No  . Sexual activity: Not on file  Lifestyle  . Physical activity:    Days per week: Not on file    Minutes per session: Not on file  . Stress: Not on file  Relationships  . Social connections:    Talks on phone: Not on file    Gets together: Not on file    Attends religious service: Not on file    Active member of club or organization: Not on file    Attends meetings of clubs or organizations: Not on file    Relationship status: Not on file  . Intimate partner violence:    Fear of current or ex partner: Not on file    Emotionally abused: Not on file    Physically abused: Not on file    Forced sexual activity: Not on file  Other Topics Concern  . Not on file  Social History Narrative  . Not on file    Review of Systems: Pt unable to offer  Physical Exam: Vital signs in last 24 hours: Temp:  [97.9 F (36.6 C)-98.5 F (36.9 C)] 98.1 F (36.7 C) (11/04 0830) Pulse Rate:  [70-102] 85 (11/04 1045) Resp:  [14-34] 34 (11/04 1045) BP: (107-137)/(33-57) 117/33 (11/04 1045) SpO2:  [96 %-100 %] 99 % (11/04 1045) Weight:  [74 kg-74.4 kg] 74 kg (11/04 1045)   General:   Alert,  Well-developed, well-nourished, pleasant and cooperative in NAD Head:  Normocephalic and atraumatic. Eyes:  Sclera clear, no icterus.   Conjunctiva pink. Ears:  Normal auditory acuity. Nose:  No deformity,  discharge,  or lesions. Mouth:  No deformity or lesions.   Neck:  Supple; no masses or thyromegaly. Lungs:  Clear throughout to auscultation.   No wheezes, crackles, or rhonchi. Heart:  Regular rate and rhythm; no murmurs, clicks, rubs,  or gallops. Abdomen:  Soft,nontender, BS active,nonpalp mass or hsm.   Rectal:  Deferred  Msk:  Symmetrical without  gross deformities. . Pulses:  Normal pulses noted. Extremities:  Without clubbing or edema. Neurologic:  Alert and  oriented x4;  grossly normal neurologically. Skin:  Intact without significant lesions or rashes.. Psych:  Alert and cooperative. Normal mood and affect.  Intake/Output from previous day: 11/03 0701 - 11/04 0700 In: 1113.1 [I.V.:0.5; Blood:630; IV Piggyback:482.7] Out: -  Intake/Output this shift: No intake/output data recorded.  Lab Results: Recent Labs    03/12/18 2320  WBC 8.8  HGB 4.1*  HCT 16.5*  PLT 347   BMET Recent Labs    03/12/18 2320  NA 137  K 3.8  CL 104  CO2 22  GLUCOSE 228*  BUN 18  CREATININE 0.81  CALCIUM 8.6*   LFT Recent Labs    03/12/18 2320  PROT 6.6  ALBUMIN 3.0*  AST 23  ALT 10  ALKPHOS 56  BILITOT 0.6   PT/INR Recent Labs    03/12/18 2354  LABPROT 13.1  INR 1.00   Hepatitis Panel No results for input(s): HEPBSAG, HCVAB, HEPAIGM, HEPBIGM in the last 72 hours.    IMPRESSION:   #42 67 year old African-American female admitted with progressive weakness unable to walk at home over the past few days.  Found to be profoundly anemic with hemoglobin of 4.1 and MCV of 55, and documented heme positive. Patient denies any GI symptoms.  Suspect slow chronic GI blood loss over multiple months.  Rule out occult colon cancer, AVMs of the lower or upper gut, chronic gastropathy, gastric lesion.  #2 adult onset diabetes mellitus, on oral agents #3 hyperlipidemia #4 probable mild cognitive impairment  #5 hyperplastic colon polyps on prior colonoscopy December 2015 with poor  prep   Plan; Patient has been transfused 2 units of packed RBCs and expect she will need 1-2 more units of blood to get hemoglobin between 7-8 Check anemia panel Will discontinue Protonix infusion and place her on Protonix 40 mg p.o. Daily Serial hemoglobins  She will need colonoscopy and EGD this admission.  She is not ready to do a bowel prep today and wants to eat.  She is agreeable to bowel prep tomorrow which may be more appropriate anyway if needing more blood today.  We will plan for bowel prep Tuesday and procedures with Dr. Ardis Hughs on Wednesday.  Procedures were discussed in detail with the patient including indications risks and benefits and she is agreeable to proceed.  Thank you we will follow with you     Mathieu Schloemer  03/13/2018, 10:58 AM

## 2018-03-13 NOTE — ED Notes (Signed)
ED TO INPATIENT HANDOFF REPORT  Name/Age/Gender [name] 67 y.o. female  Code Status   Home/SNF/Other Home  Chief Complaint generalized weakness  Level of Care/Admitting Diagnosis ED Disposition    ED Disposition Condition [facility] [place] [100102]  Level of Care: Stepdown [14]  Admit to SDU based on following criteria: Hemodynamic compromise or significant risk of instability:  Patient requiring short term acute titration and management of vasoactive drips, and invasive monitoring (i.e., CVP and Arterial line).  Diagnosis: Symptomatic anemia [[id number]]  Admitting Physician: Mariel Aloe [phone]  Attending Physician: Mariel Aloe [phone]  Estimated length of stay: past midnight tomorrow  Certification:: I certify this patient will need inpatient services for at least 2 midnights  PT Class (Do Not Modify): Inpatient [101]  PT Acc Code (Do Not Modify): Private [1]       Medical History Past Medical History:  Diagnosis Date  . Anemia   . Cognitive impairment    lifelong  . Diabetes mellitus   . Hyperlipidemia     Allergies No Known Allergies  IV Location/Drains/Wounds Patient Lines/Drains/Airways Status   Active Line/Drains/Airways    Name:   Placement date:   Placement time:   Site:   Days:   Peripheral IV 03/12/18 Left Antecubital   03/12/18    2216    Antecubital   1   Peripheral IV 03/13/18 Left Arm   03/13/18    0048    Arm   less than 1          Labs/Imaging Results for orders placed or performed during the hospital encounter of 03/12/18 (from the past 48 hour(s))  CBG monitoring, ED     Status: Abnormal   Collection Time: 03/12/18 10:31 PM  Result Value Ref Range   Glucose-Capillary 230 (H) 70 - 99 mg/dL   Comment 1 Notify RN   CBC with Differential/Platelet     Status: Abnormal   Collection Time: 03/12/18 11:20 PM  Result Value Ref Range   WBC 8.8 4.0 - 10.5 K/uL   RBC 2.97 (L) 3.87 - 5.11  MIL/uL   Hemoglobin 4.1 (LL) 12.0 - 15.0 g/dL    Comment: REPEATED TO VERIFY Reticulocyte Hemoglobin testing may be clinically indicated, consider ordering this additional test [id number] THIS CRITICAL RESULT HAS VERIFIED AND BEEN CALLED TO M,KAI BY AISHA MOHAMED ON 11 03 2019 AT 2350, AND HAS BEEN READ BACK. CRITICAL RESULT VERIFIED    HCT 16.5 (L) 36.0 - 46.0 %   MCV 55.6 (L) 80.0 - 100.0 fL   MCH 13.8 (L) 26.0 - 34.0 pg   MCHC 24.8 (L) 30.0 - 36.0 g/dL   RDW 19.8 (H) 11.5 - 15.5 %   Platelets 347 150 - 400 K/uL   nRBC 0.6 (H) 0.0 - 0.2 %   Neutrophils Relative % 80 %   Neutro Abs 7.2 1.7 - 7.7 K/uL   Lymphocytes Relative 12 %   Lymphs Abs 1.0 0.7 - 4.0 K/uL   Monocytes Relative 6 %   Monocytes Absolute 0.5 0.1 - 1.0 K/uL   Eosinophils Relative 0 %   Eosinophils Absolute 0.0 0.0 - 0.5 K/uL   Basophils Relative 1 %   Basophils Absolute 0.0 0.0 - 0.1 K/uL   Immature Granulocytes 1 %   Abs Immature Granulocytes 0.07 0.00 - 0.07 K/uL   Polychromasia PRESENT     Comment: Performed at [facility], [place] [address] [place],  [place] [address]  Comprehensive metabolic panel     Status: Abnormal   Collection Time: 03/12/18 11:20 PM  Result Value Ref Range   Sodium 137 135 - 145 mmol/L   Potassium 3.8 3.5 - 5.1 mmol/L   Chloride 104 98 - 111 mmol/L   CO2 22 22 - 32 mmol/L   Glucose, Bld 228 (H) 70 - 99 mg/dL   BUN 18 8 - 23 mg/dL   Creatinine, Ser 0.81 0.44 - 1.00 mg/dL   Calcium 8.6 (L) 8.9 - 10.3 mg/dL   Total Protein 6.6 6.5 - 8.1 g/dL   Albumin 3.0 (L) 3.5 - 5.0 g/dL   AST 23 15 - 41 U/L   ALT 10 0 - 44 U/L   Alkaline Phosphatase 56 38 - 126 U/L   Total Bilirubin 0.6 0.3 - 1.2 mg/dL   GFR calc non Af Amer >60 >60 mL/min   GFR calc Af Amer >60 >60 mL/min    Comment: (NOTE) The eGFR has been calculated using the CKD EPI equation. This calculation has not been validated in all clinical situations. eGFR's persistently <60 mL/min signify possible Chronic  Kidney Disease.    Anion gap 11 5 - 15    Comment: Performed at [facility], [place] [address], [place], [place] [address]  Lipase, blood     Status: None   Collection Time: 03/12/18 11:20 PM  Result Value Ref Range   Lipase 23 11 - 51 U/L    Comment: Performed at [facility], [place] [address], [place], [place] [address]  Troponin I     Status: None   Collection Time: 03/12/18 11:20 PM  Result Value Ref Range   Troponin I <0.03 <0.03 ng/mL    Comment: Performed at [facility], [place] [address], [place], [place] [address]  Ammonia     Status: Abnormal   Collection Time: 03/12/18 11:21 PM  Result Value Ref Range   Ammonia <9 (L) 9 - 35 umol/L    Comment: Performed at [facility], [place] [address], [place], [place] [address]  I-Stat CG4 Lactic Acid, ED     Status: Abnormal   Collection Time: 03/12/18 11:26 PM  Result Value Ref Range   Lactic Acid, Venous 5.57 (HH) 0.5 - 1.9 mmol/L   Comment NOTIFIED PHYSICIAN   Blood gas, venous     Status: Abnormal   Collection Time: 03/12/18 11:40 PM  Result Value Ref Range   O2 Content ROOM AIR L/min   Delivery systems ROOM AIR    pH, Ven 7.365 7.250 - 7.430   pCO2, Ven 36.4 (L) 44.0 - 60.0 mmHg   pO2, Ven 79.0 (H) 32.0 - 45.0 mmHg   Bicarbonate 20.3 20.0 - 28.0 mmol/L   Acid-base deficit 4.2 (H) 0.0 - 2.0 mmol/L   O2 Saturation 94.6 %   Patient temperature 98.6    Collection site VEIN    Drawn by COLLECTED BY NURSE    Sample type VENOUS     Comment: Performed at [place] [address] [place], [place] [address]  Protime-INR     Status: None   Collection Time: 03/12/18 11:54 PM  Result Value Ref Range   Prothrombin Time 13.1 11.4 - 15.2 seconds   INR 1.00     Comment: Performed at [facility], [place] [address], [place],  [address]  POC occult blood, ED Provider will collect     Status: Abnormal  Collection Time:  03/12/18 11:59 PM  Result Value Ref Range   Fecal Occult Bld POSITIVE (A) NEGATIVE  ABO/Rh     Status: None   Collection Time: 03/13/18 12:12 AM  Result Value Ref Range   ABO/RH(D)      A POS Performed at [facility], [place] [address], [place], [place] [address]   Type and screen [place]     Status: None (Preliminary result)   Collection Time: 03/13/18 12:14 AM  Result Value Ref Range   ABO/RH(D) A POS    Antibody Screen NEG    Sample Expiration 03/16/2018    Unit Number [id number]    Blood Component Type RED CELLS,LR    Unit division 00    Status of Unit ISSUED    Transfusion Status OK TO TRANSFUSE    Crossmatch Result Compatible    Unit Number [id number]    Blood Component Type RBC LR PHER1    Unit division 00    Status of Unit ISSUED    Transfusion Status OK TO TRANSFUSE    Crossmatch Result      Compatible Performed at [facility], [place] [address], [place], [place] [address]   Prepare RBC     Status: None   Collection Time: 03/13/18 12:30 AM  Result Value Ref Range   Order Confirmation      ORDER PROCESSED BY BLOOD BANK Performed at [place] [address] [place], [place] [address]   I-Stat CG4 Lactic Acid, ED     Status: Abnormal   Collection Time: 03/13/18  1:45 AM  Result Value Ref Range   Lactic Acid, Venous 3.75 (HH) 0.5 - 1.9 mmol/L   Comment NOTIFIED PHYSICIAN   Lactic acid, plasma     Status: None   Collection Time: 03/13/18  8:30 AM  Result Value Ref Range   Lactic Acid, Venous 1.4 0.5 - 1.9 mmol/L    Comment: Performed at [facility], [place] [address] [place], [place] [address]   Dg Chest 2 View  Result Date: 03/12/2018 CLINICAL DATA:  Generalized weakness. EXAM: CHEST - 2 VIEW COMPARISON:  May 18, 2014 FINDINGS: The heart size and mediastinal contours are within normal limits. Both lungs are clear. The visualized skeletal structures are  unremarkable. IMPRESSION: No active cardiopulmonary disease. Electronically Signed   By: Dorise Bullion III M.D   On: 03/12/2018 23:50   Ct Head Wo Contrast  Result Date: 03/13/2018 CLINICAL DATA:  Altered mental status EXAM: CT HEAD WITHOUT CONTRAST TECHNIQUE: Contiguous axial images were obtained from the base of the skull through the vertex without intravenous contrast. COMPARISON:  01/06/2011 FINDINGS: Brain: There is no mass, hemorrhage or extra-axial collection. The size and configuration of the ventricles and extra-axial CSF spaces are normal. The brain parenchyma is normal, without evidence of acute or chronic infarction. Vascular: No abnormal hyperdensity of the major intracranial arteries or dural venous sinuses. No intracranial atherosclerosis. Skull: The visualized skull base, calvarium and extracranial soft tissues are normal. Sinuses/Orbits: No fluid levels or advanced mucosal thickening of the visualized paranasal sinuses. No mastoid or middle ear effusion. The orbits are normal. IMPRESSION: Normal head CT. Electronically Signed   By: Ulyses Jarred M.D.   On: 03/13/2018 00:48   EKG Interpretation  Date/Time:  Sunday March 12 2018 23:26:11 EST Ventricular Rate:  87 PR Interval:    QRS Duration: 95 QT Interval:  424 QTC Calculation: 511 R Axis:  35 Text Interpretation:  Sinus rhythm Borderline T wave abnormalities Prolonged QT interval No significant change was found Confirmed by Ezequiel Essex [phone]) on 03/12/2018 11:43:07 PM Also confirmed by Ezequiel Essex [phone]), editor Hattie Perch ([phone])  on 03/13/2018 7:59:27 AM   Pending Labs Unresulted Labs (From admission, onward)    Start     Ordered   03/13/18 0831  CBC  Once,   R    Comments:  Post transfusion    03/13/18 0830   03/12/18 2255  Urinalysis, Routine w reflex microscopic  Once,   R     03/12/18 2254   Signed and Held  HIV antibody (Routine Testing)  Once,   R     Signed and Held   Signed and Held   Basic metabolic panel  Tomorrow morning,   R     Signed and Held   Signed and Held  CBC  Tomorrow morning,   R     Signed and Held          Vitals/Pain Today's Vitals   03/13/18 0717 03/13/18 0730 03/13/18 0800 03/13/18 0830  BP: (!) 116/51 (!) 112/53 (!) 116/48 (!) 107/50  Pulse: 80 78 77 80  Resp: 14 17 (!) 22 (!) 24  Temp:  98.1 F (36.7 C)  98.1 F (36.7 C)  TempSrc:  Oral  Oral  SpO2: 96% 96% 97% 100%  Weight:      Height:      PainSc:  0-No pain      Isolation Precautions No active isolations  Medications Medications  0.9 %  sodium chloride infusion (Manually program via Guardrails IV Fluids) ( Intravenous Not Given 03/13/18 0015)  pantoprazole (PROTONIX) 80 mg in sodium chloride 0.9 % 250 mL (0.32 mg/mL) infusion (8 mg/hr Intravenous Rate/Dose Verify 03/13/18 0121)  ondansetron (ZOFRAN) injection 4 mg (4 mg Intravenous Not Given 03/13/18 0204)  sodium chloride 0.9 % bolus 1,000 mL (0 mLs Intravenous Stopped 03/13/18 0036)  ondansetron (ZOFRAN) injection 4 mg (4 mg Intravenous Given 03/12/18 2322)  sodium chloride 0.9 % bolus 1,000 mL (0 mLs Intravenous Stopped 03/13/18 0201)  pantoprazole (PROTONIX) 80 mg in sodium chloride 0.9 % 100 mL IVPB (0 mg Intravenous Stopped 03/13/18 0120)  promethazine (PHENERGAN) injection 12.5 mg (12.5 mg Intravenous Given 03/13/18 0049)    Mobility walks with device

## 2018-03-13 NOTE — Progress Notes (Signed)
  Echocardiogram 2D Echocardiogram has been performed.  Darlina Sicilian M 03/13/2018, 3:16 PM

## 2018-03-13 NOTE — ED Notes (Signed)
Pt's son, Richardson [place], [place] [phone]. Pt gave verbal permission to discuss health information to son.

## 2018-03-14 DIAGNOSIS — D500 Iron deficiency anemia secondary to blood loss (chronic): Secondary | ICD-10-CM

## 2018-03-14 DIAGNOSIS — E109 Type 1 diabetes mellitus without complications: Secondary | ICD-10-CM

## 2018-03-14 DIAGNOSIS — E876 Hypokalemia: Secondary | ICD-10-CM

## 2018-03-14 LAB — BASIC METABOLIC PANEL
Anion gap: 7 (ref 5–15)
BUN: 11 mg/dL (ref 8–23)
CO2: 24 mmol/L (ref 22–32)
CREATININE: 0.82 mg/dL (ref 0.44–1.00)
Calcium: 8.2 mg/dL — ABNORMAL LOW (ref 8.9–10.3)
Chloride: 107 mmol/L (ref 98–111)
GFR calc Af Amer: >60 mL/min (ref >60)
GLUCOSE: 101 mg/dL — AB (ref 70–99)
POTASSIUM: 3.3 mmol/L — AB (ref 3.5–5.1)
SODIUM: 138 mmol/L (ref 135–145)

## 2018-03-14 LAB — TYPE AND SCREEN
ABO/RH(D): A POS
ANTIBODY SCREEN: NEGATIVE
UNIT DIVISION: 0
Unit division: 0
Unit division: 0

## 2018-03-14 LAB — BPAM RBC
Blood Product Expiration Date: 201911282359
Blood Product Expiration Date: 201911282359
Blood Product Expiration Date: 201911302359
ISSUE DATE / TIME: 201911040144
ISSUE DATE / TIME: 201911040537
ISSUE DATE / TIME: 201911041531
UNIT TYPE AND RH: 6200
UNIT TYPE AND RH: 6200
Unit Type and Rh: 6200

## 2018-03-14 LAB — URINALYSIS, ROUTINE W REFLEX MICROSCOPIC
BILIRUBIN URINE: NEGATIVE
Glucose, UA: 250 mg/dL — AB
Hgb urine dipstick: NEGATIVE
Ketones, ur: NEGATIVE mg/dL
Leukocytes, UA: NEGATIVE
NITRITE: NEGATIVE
Protein, ur: NEGATIVE mg/dL
Specific Gravity, Urine: 1.02 (ref 1.005–1.030)
pH: 6 (ref 5.0–8.0)

## 2018-03-14 LAB — CBC
HCT: 26.1 % — ABNORMAL LOW (ref 36.0–46.0)
HEMOGLOBIN: 7.7 g/dL — AB (ref 12.0–15.0)
MCH: 20.1 pg — AB (ref 26.0–34.0)
MCHC: 29.5 g/dL — AB (ref 30.0–36.0)
MCV: 68.1 fL — ABNORMAL LOW (ref 80.0–100.0)
Platelets: 231 10*3/uL (ref 150–400)
RBC: 3.83 MIL/uL — ABNORMAL LOW (ref 3.87–5.11)
WBC: 8.8 10*3/uL (ref 4.0–10.5)
nRBC: 1.5 % — ABNORMAL HIGH (ref 0.0–0.2)

## 2018-03-14 LAB — GLUCOSE, CAPILLARY
GLUCOSE-CAPILLARY: 113 mg/dL — AB (ref 70–99)
Glucose-Capillary: 120 mg/dL — ABNORMAL HIGH (ref 70–99)
Glucose-Capillary: 111 mg/dL — ABNORMAL HIGH (ref 70–99)
Glucose-Capillary: 186 mg/dL — ABNORMAL HIGH (ref 70–99)

## 2018-03-14 LAB — HIV ANTIBODY (ROUTINE TESTING W REFLEX): HIV SCREEN 4TH GENERATION: NONREACTIVE

## 2018-03-14 MED ORDER — ORAL CARE MOUTH RINSE
15.0000 mL | Freq: Two times a day (BID) | OROMUCOSAL | Status: DC
  Administered 2018-03-15: 15 mL via OROMUCOSAL

## 2018-03-14 MED ORDER — SODIUM CHLORIDE 0.9 % IV SOLN
510.0000 mg | Freq: Once | INTRAVENOUS | Status: AC
  Administered 2018-03-14: 510 mg via INTRAVENOUS
  Filled 2018-03-14: qty 17

## 2018-03-14 MED ORDER — PEG-KCL-NACL-NASULF-NA ASC-C 100 G PO SOLR
0.5000 | Freq: Two times a day (BID) | ORAL | Status: AC
  Administered 2018-03-14 – 2018-03-15 (×2): 100 g via ORAL
  Filled 2018-03-14: qty 1

## 2018-03-14 MED ORDER — POTASSIUM CHLORIDE CRYS ER 20 MEQ PO TBCR
40.0000 meq | EXTENDED_RELEASE_TABLET | Freq: Once | ORAL | Status: AC
  Administered 2018-03-14: 40 meq via ORAL
  Filled 2018-03-14: qty 2

## 2018-03-14 MED ORDER — CHLORHEXIDINE GLUCONATE 0.12 % MT SOLN
15.0000 mL | Freq: Two times a day (BID) | OROMUCOSAL | Status: DC
  Administered 2018-03-15: 15 mL via OROMUCOSAL
  Filled 2018-03-14 (×2): qty 15

## 2018-03-14 NOTE — Progress Notes (Signed)
PROGRESS NOTE    AIMAN SONN  OJJ:[id number] DOB: December 05, 1950 DOA: 03/12/2018 PCP: Janifer Adie, MD      Brief Narrative:  Mrs. Matson is a 67 y.o. F with cognitive impairment vs Dementia, community dwelling but enrolled in PACE program and DM who presented with weakness, found to have microcytic anemia, Hgb 4.1 g/dL.   Assessment & Plan:  Symptomatic anemia, likely chronic blood loss Iron deficiency anemia No melena, hematochezia or other clinical bleeding in hospital.  Transfused 3 units, Hgb now 7.7 g/dL.  Iron severely low, ferritin <5.  Has hx of anemia in past, (2012) had colonoscopy in 2015 with poor prep, only few adenomas. -Consult GI, appreciate cares -Trend CBC -Given Feraheme today, discharge on oral iron -GI plan rpep this afternoon and EGD/colonoscopy tomorrow -Continue PPI   Hypotension Normalized  Cognitive impairment Son describes this as dementia.    Diabetes -Hold home metformin, aspirin 81 -SSI with meals -Continue statin  Rales, resolved No rales on exam today.  Got 3 units blood and not hypoxic or out of breath.  Echo report reviewed, unremakrable, EF 55%, grade 1 DD.  Hypokalemia K low today -Supplement K        MDM and disposition: The below labs and imaging reports were reviewed and summarized above.  Medication management as above.  The patient was admitted with symptomatic anemia, likely chronic blood loss.  She had a significant drop, from her previous known baseline 11 to 4, unknown acuity, and was hypotensive on arrival.  Given her cognitive impairment, previous documented challenges in managing her own outpatient bowel prep, and her hypotension with severe life-threatening anemia at presentation, we will maintain inpatient for Coloonoscopy/EGD tomorrow, then likely home pending findings.          DVT prophylaxis: SCDs Code Status: FULL Family Communication: Son     Consultants:   GI, [place]  Procedures:    CT head 11/4 unremarakble  Colonoscopy pending 11/6  Antimicrobials:   None    Subjective: Feeling well.  Hungry.  No confusion.  No fever, change in weakness.  No melena or hematochezia  Objective: Vitals:   03/14/18 0321 03/14/18 0400 03/14/18 0600 03/14/18 0700  BP:  (!) 127/48 (!) 130/49 (!) 127/108  Pulse:  84 77 81  Resp:  20 17 19   Temp: 98.4 F (36.9 C)     TempSrc: Oral     SpO2:  96% 94% 97%  Weight:      Height:        Intake/Output Summary (Last 24 hours) at 03/14/2018 0939 Last data filed at 03/13/2018 1745 Gross per 24 hour  Intake 317.5 ml  Output -  Net 317.5 ml   Filed Weights   03/12/18 2220 03/13/18 1045  Weight: 74.4 kg 74 kg    Examination: General appearance:  adult female, alert and in no acute distress.  Sitting up in bed HEENT: Anicteric, conjunctiva pink, lids and lashes normal. No nasal deformity, discharge, epistaxis.  Lips moist, edentulous, OP moist, no oral lesions, hearing normal.   Skin: Warm and dry.  Slightly pale, no jaundice.  No suspicious rashes or lesions. Cardiac: RRR, nl S1-S2, no murmurs appreciated.  Capillary refill is brisk.  JVP normal.  No LE edema.    Respiratory: Normal respiratory rate and rhythm.  CTAB without rales or wheezes. Abdomen: Abdomen soft.  No TTP. No ascites, distension, hepatosplenomegaly.   MSK: No deformities or effusions. Neuro: Awake and alert.  EOMI, moves all extremities.  Speech fluent.    Psych: Sensorium intact and responding to questions, attention normal. Affect pleasant.  Judgment and insight appear impaired.    Data Reviewed: I have personally reviewed following labs and imaging studies:  CBC: Recent Labs  Lab 03/12/18 2320 03/13/18 1116 03/14/18 0259  WBC 8.8 9.7 8.8  NEUTROABS 7.2  --   --   HGB 4.1* 7.2* 7.7*  HCT 16.5* 25.5* 26.1*  MCV 55.6* 66.4* 68.1*  PLT 347 291 144   Basic Metabolic Panel: Recent Labs  Lab 03/12/18 2320 03/14/18 0259  NA 137 138  K 3.8 3.3*   CL 104 107  CO2 22 24  GLUCOSE 228* 101*  BUN 18 11  CREATININE 0.81 0.82  CALCIUM 8.6* 8.2*   GFR: Estimated Creatinine Clearance: 65 mL/min (by C-G formula based on SCr of 0.82 mg/dL). Liver Function Tests: Recent Labs  Lab 03/12/18 2320  AST 23  ALT 10  ALKPHOS 56  BILITOT 0.6  PROT 6.6  ALBUMIN 3.0*   Recent Labs  Lab 03/12/18 2320  LIPASE 23   Recent Labs  Lab 03/12/18 2321  AMMONIA <9*   Coagulation Profile: Recent Labs  Lab 03/12/18 2354  INR 1.00   Cardiac Enzymes: Recent Labs  Lab 03/12/18 2320  TROPONINI <0.03   BNP (last 3 results) No results for input(s): PROBNP in the last 8760 hours. HbA1C: No results for input(s): HGBA1C in the last 72 hours. CBG: Recent Labs  Lab 03/12/18 2231 03/13/18 1236 03/13/18 1617 03/13/18 2130 03/14/18 0742  GLUCAP 230* 153* 105* 147* 120*   Lipid Profile: No results for input(s): CHOL, HDL, LDLCALC, TRIG, CHOLHDL, LDLDIRECT in the last 72 hours. Thyroid Function Tests: No results for input(s): TSH, T4TOTAL, FREET4, T3FREE, THYROIDAB in the last 72 hours. Anemia Panel: Recent Labs    03/13/18 1116  VITAMINB12 140*  FOLATE 11.7  FERRITIN 2*  TIBC 510*  IRON 24*  RETICCTPCT 0.2*   Urine analysis:    Component Value Date/Time   COLORURINE YELLOW 03/14/2018 0106   APPEARANCEUR CLEAR 03/14/2018 0106   LABSPEC 1.020 03/14/2018 0106   PHURINE 6.0 03/14/2018 0106   GLUCOSEU 250 (A) 03/14/2018 0106   HGBUR NEGATIVE 03/14/2018 0106   BILIRUBINUR NEGATIVE 03/14/2018 0106   KETONESUR NEGATIVE 03/14/2018 0106   PROTEINUR NEGATIVE 03/14/2018 0106   NITRITE NEGATIVE 03/14/2018 0106   LEUKOCYTESUR NEGATIVE 03/14/2018 0106   Sepsis Labs: @LABRCNTIP (procalcitonin:4,lacticacidven:4)  ) Recent Results (from the past 240 hour(s))  MRSA PCR Screening     Status: None   Collection Time: 03/13/18 10:47 AM  Result Value Ref Range Status   MRSA by PCR NEGATIVE NEGATIVE Final    Comment:        The  GeneXpert MRSA Assay (FDA approved for NASAL specimens only), is one component of a comprehensive MRSA colonization surveillance program. It is not intended to diagnose MRSA infection nor to guide or monitor treatment for MRSA infections. Performed at [facility], [place] [address], [place], [place] [address]          Radiology Studies: Dg Chest 2 View  Result Date: 03/13/2018 CLINICAL DATA:  Generalized weakness with nausea and vomiting. EXAM: CHEST - 2 VIEW COMPARISON:  03/12/2018, 05/18/2014 and CT 04/29/2005 FINDINGS: Lungs are hypoinflated without effusion or pneumothorax. There is increased density over the posterior lung bases on the lateral film unchanged and likely due to patient's known moderate size hiatal hernia. Cardiomediastinal silhouette and remainder of the exam is unchanged. IMPRESSION: Hypoinflation without  acute cardiopulmonary disease. Hiatal hernia. Electronically Signed   By: Marin Olp M.D.   On: 03/13/2018 09:22   Dg Chest 2 View  Result Date: 03/12/2018 CLINICAL DATA:  Generalized weakness. EXAM: CHEST - 2 VIEW COMPARISON:  May 18, 2014 FINDINGS: The heart size and mediastinal contours are within normal limits. Both lungs are clear. The visualized skeletal structures are unremarkable. IMPRESSION: No active cardiopulmonary disease. Electronically Signed   By: Dorise Bullion III M.D   On: 03/12/2018 23:50   Ct Head Wo Contrast  Result Date: 03/13/2018 CLINICAL DATA:  Altered mental status EXAM: CT HEAD WITHOUT CONTRAST TECHNIQUE: Contiguous axial images were obtained from the base of the skull through the vertex without intravenous contrast. COMPARISON:  01/06/2011 FINDINGS: Brain: There is no mass, hemorrhage or extra-axial collection. The size and configuration of the ventricles and extra-axial CSF spaces are normal. The brain parenchyma is normal, without evidence of acute or chronic infarction. Vascular: No abnormal hyperdensity of the  major intracranial arteries or dural venous sinuses. No intracranial atherosclerosis. Skull: The visualized skull base, calvarium and extracranial soft tissues are normal. Sinuses/Orbits: No fluid levels or advanced mucosal thickening of the visualized paranasal sinuses. No mastoid or middle ear effusion. The orbits are normal. IMPRESSION: Normal head CT. Electronically Signed   By: Ulyses Jarred M.D.   On: 03/13/2018 00:48        Scheduled Meds: . sodium chloride   Intravenous Once  . sodium chloride   Intravenous Once  . insulin aspart  0-9 Units Subcutaneous TID WC  . pantoprazole  40 mg Oral Q0600  . potassium chloride  40 mEq Oral Once  . pravastatin  20 mg Oral Daily   Continuous Infusions: . ferumoxytol       LOS: 1 day    Time spent: 25 minutes    Edwin Dada, MD [facility] 03/14/2018, 9:39 AM     Pager [phone] --- please page though AMION:  [email or web address] If 7PM-7AM, please contact night-coverage

## 2018-03-14 NOTE — H&P (View-Only) (Signed)
Patient ID: [name], female   DOB: November 17, 1950, 67 y.o.   MRN: [id number]    Progress Note   Subjective  In good spirits today - hungry !, tolerated blood transfusions without difficulty. No complaints   Son at bedside this am - updated - also confirms  Prior dementia diagnosis  Hgb 7.7 this am - post 3 units  Ferritin 2 Fe 24/ TIBC 510/ sat 5   Objective   Vital signs in last 24 hours: Temp:  [97.6 F (36.4 C)-98.6 F (37 C)] 98.4 F (36.9 C) (11/05 0321) Pulse Rate:  [73-94] 81 (11/05 0700) Resp:  [17-35] 19 (11/05 0700) BP: (93-138)/(33-108) 127/108 (11/05 0700) SpO2:  [94 %-100 %] 97 % (11/05 0700) Weight:  [74 kg] 74 kg (11/04 1045)   General:    AA  female in NAD Heart:  Regular rate and rhythm; no murmurs Lungs: Respirations even and unlabored, lungs CTA bilaterally Abdomen:  Soft, nontender and nondistended. Normal bowel sounds. Extremities:  Without edema. Neurologic:  Alert and oriented,  grossly normal neurologically. Psych:  Cooperative. Normal mood and affect.  Intake/Output from previous day: 11/04 0701 - 11/05 0700 In: 317.5 [Blood:317.5] Out: -  Intake/Output this shift: No intake/output data recorded.  Lab Results: Recent Labs    03/12/18 2320 03/13/18 1116 03/14/18 0259  WBC 8.8 9.7 8.8  HGB 4.1* 7.2* 7.7*  HCT 16.5* 25.5* 26.1*  PLT 347 291 231   BMET Recent Labs    03/12/18 2320 03/14/18 0259  NA 137 138  K 3.8 3.3*  CL 104 107  CO2 22 24  GLUCOSE 228* 101*  BUN 18 11  CREATININE 0.81 0.82  CALCIUM 8.6* 8.2*   LFT Recent Labs    03/12/18 2320  PROT 6.6  ALBUMIN 3.0*  AST 23  ALT 10  ALKPHOS 56  BILITOT 0.6   PT/INR Recent Labs    03/12/18 2354  LABPROT 13.1  INR 1.00    Studies/Results: Dg Chest 2 View  Result Date: 03/13/2018 CLINICAL DATA:  Generalized weakness with nausea and vomiting. EXAM: CHEST - 2 VIEW COMPARISON:  03/12/2018, 05/18/2014 and CT 04/29/2005 FINDINGS: Lungs are hypoinflated without  effusion or pneumothorax. There is increased density over the posterior lung bases on the lateral film unchanged and likely due to patient's known moderate size hiatal hernia. Cardiomediastinal silhouette and remainder of the exam is unchanged. IMPRESSION: Hypoinflation without acute cardiopulmonary disease. Hiatal hernia. Electronically Signed   By: Marin Olp M.D.   On: 03/13/2018 09:22   Dg Chest 2 View  Result Date: 03/12/2018 CLINICAL DATA:  Generalized weakness. EXAM: CHEST - 2 VIEW COMPARISON:  May 18, 2014 FINDINGS: The heart size and mediastinal contours are within normal limits. Both lungs are clear. The visualized skeletal structures are unremarkable. IMPRESSION: No active cardiopulmonary disease. Electronically Signed   By: Dorise Bullion III M.D   On: 03/12/2018 23:50   Ct Head Wo Contrast  Result Date: 03/13/2018 CLINICAL DATA:  Altered mental status EXAM: CT HEAD WITHOUT CONTRAST TECHNIQUE: Contiguous axial images were obtained from the base of the skull through the vertex without intravenous contrast. COMPARISON:  01/06/2011 FINDINGS: Brain: There is no mass, hemorrhage or extra-axial collection. The size and configuration of the ventricles and extra-axial CSF spaces are normal. The brain parenchyma is normal, without evidence of acute or chronic infarction. Vascular: No abnormal hyperdensity of the major intracranial arteries or dural venous sinuses. No intracranial atherosclerosis. Skull: The visualized skull base, calvarium and extracranial soft  tissues are normal. Sinuses/Orbits: No fluid levels or advanced mucosal thickening of the visualized paranasal sinuses. No mastoid or middle ear effusion. The orbits are normal. IMPRESSION: Normal head CT. Electronically Signed   By: Ulyses Jarred M.D.   On: 03/13/2018 00:48       Assessment / Plan:    #1 67 yo AA female with mild dementia admitted with weakness, HGB 4.1, heme positive stool. Severely Iron deficient   R/O colon  neoplasm, AVMs, chronic gastropathy, gastric or small bowel lesion   #2 AODM   #3 hx hyperplastic colon polyps - Colon 04/2014 - poor prep    Plan;    allow breakfast now, then clear liquids, NPO after MN  Bowel prep later today   EGD and Colonoscopy with Dr Ardis Hughs in am tomorrow   Transfuse to keep HGB 7-8  Pt also needs IV fereheme  X 2   Discussed plan with pt and son , procedures explained in detail  and they are agreeable to proceed     Contact  Amy [place], P.A.-C               [phone]      Active Problems:   Anemia   Melena   Symptomatic anemia   Blood in stool     LOS: 1 day   Amy Esterwood  03/14/2018, 9:27 AM

## 2018-03-14 NOTE — Care Management Note (Signed)
Case Management Note  Patient Details  Name: ARRYN TERRONES MRN: [id number] Date of Birth: 03-21-1951  Subjective/Objective:                  Being transferred from sdu to med-surg bed  Action/Plan: Following for needs  Expected Discharge Date:  (unknown)               Expected Discharge Plan:  Home/Self Care  In-House Referral:     Discharge planning Services  CM Consult  Post Acute Care Choice:    Choice offered to:     DME Arranged:    DME Agency:     HH Arranged:    HH Agency:     Status of Service:  In process, will continue to follow  If discussed at Long Length of Stay Meetings, dates discussed:    Additional Comments:  Leeroy Cha, RN 03/14/2018, 9:59 AM

## 2018-03-14 NOTE — Progress Notes (Signed)
Patient ID: [name], female   DOB: [birth year], 67 y.o.   MRN: [id number]    Progress Note   Subjective  In good spirits today - hungry !, tolerated blood transfusions without difficulty. No complaints   Son at bedside this am - updated - also confirms  Prior dementia diagnosis  Hgb 7.7 this am - post 3 units  Ferritin 2 Fe 24/ TIBC 510/ sat 5   Objective   Vital signs in last 24 hours: Temp:  [97.6 F (36.4 C)-98.6 F (37 C)] 98.4 F (36.9 C) (11/05 0321) Pulse Rate:  [73-94] 81 (11/05 0700) Resp:  [17-35] 19 (11/05 0700) BP: (93-138)/(33-108) 127/108 (11/05 0700) SpO2:  [94 %-100 %] 97 % (11/05 0700) Weight:  [74 kg] 74 kg (11/04 1045)   General:    AA  female in NAD Heart:  Regular rate and rhythm; no murmurs Lungs: Respirations even and unlabored, lungs CTA bilaterally Abdomen:  Soft, nontender and nondistended. Normal bowel sounds. Extremities:  Without edema. Neurologic:  Alert and oriented,  grossly normal neurologically. Psych:  Cooperative. Normal mood and affect.  Intake/Output from previous day: 11/04 0701 - 11/05 0700 In: 317.5 [Blood:317.5] Out: -  Intake/Output this shift: No intake/output data recorded.  Lab Results: Recent Labs    03/12/18 2320 03/13/18 1116 03/14/18 0259  WBC 8.8 9.7 8.8  HGB 4.1* 7.2* 7.7*  HCT 16.5* 25.5* 26.1*  PLT 347 291 231   BMET Recent Labs    03/12/18 2320 03/14/18 0259  NA 137 138  K 3.8 3.3*  CL 104 107  CO2 22 24  GLUCOSE 228* 101*  BUN 18 11  CREATININE 0.81 0.82  CALCIUM 8.6* 8.2*   LFT Recent Labs    03/12/18 2320  PROT 6.6  ALBUMIN 3.0*  AST 23  ALT 10  ALKPHOS 56  BILITOT 0.6   PT/INR Recent Labs    03/12/18 2354  LABPROT 13.1  INR 1.00    Studies/Results: Dg Chest 2 View  Result Date: 03/13/2018 CLINICAL DATA:  Generalized weakness with nausea and vomiting. EXAM: CHEST - 2 VIEW COMPARISON:  03/12/2018, 05/18/2014 and CT 04/29/2005 FINDINGS: Lungs are hypoinflated without  effusion or pneumothorax. There is increased density over the posterior lung bases on the lateral film unchanged and likely due to patient's known moderate size hiatal hernia. Cardiomediastinal silhouette and remainder of the exam is unchanged. IMPRESSION: Hypoinflation without acute cardiopulmonary disease. Hiatal hernia. Electronically Signed   By: Marin Olp M.D.   On: 03/13/2018 09:22   Dg Chest 2 View  Result Date: 03/12/2018 CLINICAL DATA:  Generalized weakness. EXAM: CHEST - 2 VIEW COMPARISON:  May 18, 2014 FINDINGS: The heart size and mediastinal contours are within normal limits. Both lungs are clear. The visualized skeletal structures are unremarkable. IMPRESSION: No active cardiopulmonary disease. Electronically Signed   By: Dorise Bullion III M.D   On: 03/12/2018 23:50   Ct Head Wo Contrast  Result Date: 03/13/2018 CLINICAL DATA:  Altered mental status EXAM: CT HEAD WITHOUT CONTRAST TECHNIQUE: Contiguous axial images were obtained from the base of the skull through the vertex without intravenous contrast. COMPARISON:  01/06/2011 FINDINGS: Brain: There is no mass, hemorrhage or extra-axial collection. The size and configuration of the ventricles and extra-axial CSF spaces are normal. The brain parenchyma is normal, without evidence of acute or chronic infarction. Vascular: No abnormal hyperdensity of the major intracranial arteries or dural venous sinuses. No intracranial atherosclerosis. Skull: The visualized skull base, calvarium and extracranial soft  tissues are normal. Sinuses/Orbits: No fluid levels or advanced mucosal thickening of the visualized paranasal sinuses. No mastoid or middle ear effusion. The orbits are normal. IMPRESSION: Normal head CT. Electronically Signed   By: Ulyses Jarred M.D.   On: 03/13/2018 00:48       Assessment / Plan:    #1 67 yo AA female with mild dementia admitted with weakness, HGB 4.1, heme positive stool. Severely Iron deficient   R/O colon  neoplasm, AVMs, chronic gastropathy, gastric or small bowel lesion   #2 AODM   #3 hx hyperplastic colon polyps - Colon 04/2014 - poor prep    Plan;    allow breakfast now, then clear liquids, NPO after MN  Bowel prep later today   EGD and Colonoscopy with Dr Ardis Hughs in am tomorrow   Transfuse to keep HGB 7-8  Pt also needs IV fereheme  X 2   Discussed plan with pt and son , procedures explained in detail  and they are agreeable to proceed     Contact  Riane Rung Zeba, P.A.-C               [phone]      Active Problems:   Anemia   Melena   Symptomatic anemia   Blood in stool     LOS: 1 day   [name]  03/14/2018, 9:27 AM

## 2018-03-15 ENCOUNTER — Encounter (HOSPITAL_COMMUNITY)
Admission: EM | Disposition: A | Discharge status: Home / Self Care | Payer: Self-pay | Source: Home / Self Care | Attending: Family Medicine

## 2018-03-15 ENCOUNTER — Encounter (HOSPITAL_COMMUNITY): Payer: Self-pay | Admitting: Gastroenterology

## 2018-03-15 DIAGNOSIS — K254 Chronic or unspecified gastric ulcer with hemorrhage: Principal | ICD-10-CM

## 2018-03-15 DIAGNOSIS — F0390 Unspecified dementia without behavioral disturbance: Secondary | ICD-10-CM

## 2018-03-15 DIAGNOSIS — E1169 Type 2 diabetes mellitus with other specified complication: Secondary | ICD-10-CM

## 2018-03-15 DIAGNOSIS — D508 Other iron deficiency anemias: Secondary | ICD-10-CM

## 2018-03-15 DIAGNOSIS — K259 Gastric ulcer, unspecified as acute or chronic, without hemorrhage or perforation: Secondary | ICD-10-CM

## 2018-03-15 DIAGNOSIS — D649 Anemia, unspecified: Secondary | ICD-10-CM

## 2018-03-15 DIAGNOSIS — R195 Other fecal abnormalities: Secondary | ICD-10-CM

## 2018-03-15 DIAGNOSIS — K921 Melena: Secondary | ICD-10-CM

## 2018-03-15 DIAGNOSIS — K222 Esophageal obstruction: Secondary | ICD-10-CM

## 2018-03-15 HISTORY — PX: COLONOSCOPY WITH PROPOFOL: SHX5780

## 2018-03-15 HISTORY — PX: BIOPSY: SHX5522

## 2018-03-15 HISTORY — PX: ESOPHAGOGASTRODUODENOSCOPY (EGD) WITH PROPOFOL: SHX5813

## 2018-03-15 LAB — BASIC METABOLIC PANEL
Anion gap: 8 (ref 5–15)
BUN: 5 mg/dL — AB (ref 8–23)
CALCIUM: 9.1 mg/dL (ref 8.9–10.3)
CHLORIDE: 108 mmol/L (ref 98–111)
CO2: 25 mmol/L (ref 22–32)
Creatinine, Ser: 0.82 mg/dL (ref 0.44–1.00)
GFR calc non Af Amer: >60 mL/min (ref >60)
GLUCOSE: 154 mg/dL — AB (ref 70–99)
Potassium: 3.4 mmol/L — ABNORMAL LOW (ref 3.5–5.1)
Sodium: 141 mmol/L (ref 135–145)

## 2018-03-15 LAB — CBC
HEMATOCRIT: 30.6 % — AB (ref 36.0–46.0)
HEMOGLOBIN: 8.9 g/dL — AB (ref 12.0–15.0)
MCH: 19.9 pg — AB (ref 26.0–34.0)
MCHC: 29.1 g/dL — AB (ref 30.0–36.0)
MCV: 68.3 fL — ABNORMAL LOW (ref 80.0–100.0)
Platelets: 251 10*3/uL (ref 150–400)
RBC: 4.48 MIL/uL (ref 3.87–5.11)
WBC: 8 10*3/uL (ref 4.0–10.5)
nRBC: 2.7 % — ABNORMAL HIGH (ref 0.0–0.2)

## 2018-03-15 LAB — GLUCOSE, CAPILLARY
GLUCOSE-CAPILLARY: 104 mg/dL — AB (ref 70–99)
Glucose-Capillary: 135 mg/dL — ABNORMAL HIGH (ref 70–99)
Glucose-Capillary: 134 mg/dL — ABNORMAL HIGH (ref 70–99)

## 2018-03-15 SURGERY — COLONOSCOPY WITH PROPOFOL
Anesthesia: Moderate Sedation

## 2018-03-15 MED ORDER — SODIUM CHLORIDE 0.9 % IV SOLN
INTRAVENOUS | Status: DC
  Administered 2018-03-15: 09:00:00 via INTRAVENOUS

## 2018-03-15 MED ORDER — FENTANYL CITRATE (PF) 100 MCG/2ML IJ SOLN
INTRAMUSCULAR | Status: DC | PRN
  Administered 2018-03-15 (×4): 25 ug via INTRAVENOUS

## 2018-03-15 MED ORDER — MIDAZOLAM HCL 5 MG/5ML IJ SOLN
INTRAMUSCULAR | Status: DC | PRN
  Administered 2018-03-15 (×4): 2 mg via INTRAVENOUS

## 2018-03-15 MED ORDER — BUTAMBEN-TETRACAINE-BENZOCAINE 2-2-14 % EX AERO
INHALATION_SPRAY | CUTANEOUS | Status: DC | PRN
  Administered 2018-03-15: 1 via TOPICAL

## 2018-03-15 MED ORDER — PANTOPRAZOLE SODIUM 40 MG PO TBEC: 40.0000 mg | DELAYED_RELEASE_TABLET | Freq: Two times a day (BID) | ORAL | 3 refills | Status: AC

## 2018-03-15 MED ORDER — FLEET ENEMA 7-19 GM/118ML RE ENEM
2.0000 | ENEMA | Freq: Once | RECTAL | Status: AC
  Administered 2018-03-15: 2 via RECTAL
  Filled 2018-03-15: qty 2

## 2018-03-15 MED ORDER — FENTANYL CITRATE (PF) 100 MCG/2ML IJ SOLN
INTRAMUSCULAR | Status: AC
  Filled 2018-03-15: qty 4

## 2018-03-15 MED ORDER — DIPHENHYDRAMINE HCL 50 MG/ML IJ SOLN
INTRAMUSCULAR | Status: AC
  Filled 2018-03-15: qty 1

## 2018-03-15 MED ORDER — FERROUS SULFATE 325 (65 FE) MG PO TBEC: 325.0000 mg | DELAYED_RELEASE_TABLET | Freq: Every day | ORAL | 3 refills | Status: AC

## 2018-03-15 MED ORDER — DOCUSATE SODIUM 100 MG PO CAPS: 100.0000 mg | ORAL_CAPSULE | Freq: Every day | ORAL | 2 refills | Status: AC

## 2018-03-15 MED ORDER — PANTOPRAZOLE SODIUM 40 MG PO TBEC: 40.0000 mg | DELAYED_RELEASE_TABLET | Freq: Two times a day (BID) | ORAL | Status: DC

## 2018-03-15 SURGICAL SUPPLY — 25 items

## 2018-03-15 NOTE — Op Note (Signed)
[facility] Patient Name: [name] Procedure Date: 03/15/2018 MRN: [id number] Attending MD: Milus Banister , MD Date of Birth: June 26, 1950 CSN: [id number] Age: 67 Admit Type: Inpatient Procedure:                Colonoscopy Indications:              Iron deficiency anemia, severe (Hb 4.1 ad admit),                            no overt bleeding but hemocult + stool Providers:                Milus Banister, MD, Debi Mays, RN, Elspeth Cho                            Tech., Technician Referring MD:              Medicines:                Fentanyl 50 micrograms IV, Midazolam 4 mg IV Complications:            No immediate complications. Estimated blood loss:                            None. Estimated Blood Loss:     Estimated blood loss: none. Procedure:                Pre-Anesthesia Assessment:                           - Prior to the procedure, a History and Physical                            was performed, and patient medications and                            allergies were reviewed. The patient's tolerance of                            previous anesthesia was also reviewed. The risks                            and benefits of the procedure and the sedation                            options and risks were discussed with the patient.                            All questions were answered, and informed consent                            was obtained. Prior Anticoagulants: The patient has                            taken no previous anticoagulant or antiplatelet  agents. ASA Grade Assessment: III - A patient with                            severe systemic disease. After reviewing the risks                            and benefits, the patient was deemed in                            satisfactory condition to undergo the procedure.                           After obtaining informed consent, the colonoscope                            was  passed under direct vision. Throughout the                            procedure, the patient's blood pressure, pulse, and                            oxygen saturations were monitored continuously. The                            CF-HQ190L ([id number]) Olympus adult colonoscope was                            introduced through the anus and advanced to the the                            cecum, identified by appendiceal orifice and                            ileocecal valve. The colonoscopy was performed                            without difficulty. The patient tolerated the                            procedure well. The quality of the bowel                            preparation was good. The ileocecal valve,                            appendiceal orifice, and rectum were photographed. Scope In: 9:17:10 AM Scope Out: 9:28:20 AM Scope Withdrawal Time: 0 hours 7 minutes 2 seconds  Total Procedure Duration: 0 hours 11 minutes 10 seconds  Findings:      The entire examined colon appeared normal on direct and retroflexion       views. Impression:               - The entire examined colon is normal on direct and  retroflexion views.                           - No polyps or cancers. Moderate Sedation:      Moderate (conscious) sedation was administered by the endoscopy nurse       and supervised by the endoscopist. The patient's oxygen saturation,       heart rate, blood pressure and response to care were monitored. Total       physician intraservice time was 17 minutes. Recommendation:           - EGD now to continue workup for severe IDA                           - Repeat colonoscopy for routine risk colon cancer                            screening in 10 years Procedure Code(s):        --- Professional ---                           [phone], Colonoscopy, flexible; diagnostic, including                            collection of specimen(s) by brushing or washing,                             when performed (separate procedure)                           99152, 59, Moderate sedation services provided by                            the same physician or other qualified health care                            professional performing the diagnostic or                            therapeutic service that the sedation supports,                            requiring the presence of an independent trained                            observer to assist in the monitoring of the                            patient's level of consciousness and physiological                            status; initial 15 minutes of intraservice time,                            patient age 96 years or older Diagnosis Code(s):        --- Professional ---  D50.9, Iron deficiency anemia, unspecified CPT copyright 2018 [employer]. All rights reserved. The codes documented in this report are preliminary and upon coder review may  be revised to meet current compliance requirements. Milus Banister, MD 03/15/2018 9:49:37 AM This report has been signed electronically. Number of Addenda: 0

## 2018-03-15 NOTE — Discharge Summary (Signed)
Physician Discharge Summary  [name] DPO:[id number] DOB: 1951-03-10 DOA: 03/12/2018  PCP: Janifer Adie, MD  Admit date: 03/12/2018 Discharge date: 03/15/2018  Admitted From: Home  Disposition:  Home   Recommendations for Outpatient Follow-up:  1. Follow up with PCP in 1 week 2. Please obtain CBC in one month 3. Please ensure follow up with [facility] in 2-4 weeks for repeat EGD in 2 months 4. Please follow up on the following pending results: H pylori testing, gastric biopsy     Home Health: None  Equipment/Devices: None  Discharge Condition: Good  CODE STATUS: FULL Diet recommendation: Regular  Brief/Interim Summary: [name] is a 67 y.o. F with cognitive impairment vs Dementia, community dwelling but enrolled in PACE program and DM who presented with slowly progressive weakness, now severe with dyspnea, found to have microcytic anemia, Hgb 4.1 g/dL.     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  Transfused 3 units, no further bleeding.  Colonscopy unremarkable.  1.5cm gastric ulcer found on EGD, likely from aspirin therapy.  Hgb up to 8.9 g/dL on day of discharge. -Stop aspirin -Pantoprazole 40 BID for 2 months -Repeat EGD in 2 months -Follow up with [facility] -Repeat colonoscopy in 10 years for routine CRC screening  Iron deficiency anemia Ferritin 2 ng/mL and iron saturation 5%.  Given Feraheme.  Discharge with new ferrous sulfate 325 BID and docusate.    Hypotension  Present on admission, resolved with transfusion.   Cognitive impairment Son describes this as dementia.  Diabetes Resume home regimen at discharge.  Stop aspirin given that it is for primary prevention in  diabetes.  Rales, resolved Initially with rales on exam.  Echo unremarkable, EF 55%, grade I DD.  Resolved on its own, no symptoms of CHF on discharge, not requiring oxygen or dyspneic at all.    Hypokalemia Repleted.        Discharge Instructions  Discharge Instructions    Diet Carb Modified   Complete by:  As directed    Discharge instructions   Complete by:  As directed    From Dr. Loleta Books: You were admitted with feeling tired.   It turned out this was from ANEMIA.  "Anemia" means that your blood level was low, because you were losing blood from an ulcer in your stomach. This ulcer may have been caused by baby aspirin. For now, I would stop baby aspirin.  You should schedule a follow up appointment with your PACE program, and discuss with them if you should restart aspirin or not.  To replace the blood, your body will need plenty of iron. Take an iron pill every day (this is called ferrous sulfate) Have your doctor check your blood level in 1 month.  Call the stomach specialists [facility]) for an appointment in 2 weeks: [phone]    Bottom line: START TAKING TWO NEW MEDICINES: Protonix 40 mg twice daily Ferrous sulfate 325 mg twice daily  Protonix protects the stomach from acid.   Ferrous sulfate replaces your iron. Ferrous sulfate can make you constipated, so take docusate/Colace (which is a stool softener)    STOP TAKING ASPIRIN   Increase activity slowly   Complete by:  As directed      Allergies as of 03/15/2018   No Known  Allergies     Medication List    STOP taking these medications   aspirin EC 81 MG tablet     TAKE these medications   docusate sodium 100 MG capsule Commonly known as:  COLACE Take 1 capsule (100 mg total) by mouth daily.   ferrous sulfate 325 (65 FE) MG EC tablet Take 1 tablet (325 mg total) by mouth daily with breakfast.   metFORMIN 1000 MG tablet Commonly known as:  GLUCOPHAGE Take 1,000 mg by  mouth 2 (two) times daily with a meal.   pantoprazole 40 MG tablet Commonly known as:  PROTONIX Take 1 tablet (40 mg total) by mouth 2 (two) times daily before a meal.   pravastatin 20 MG tablet Commonly known as:  PRAVACHOL Take 20 mg by mouth daily.   Vitamin D3 25 MCG (1000 UT) Caps Take 1 capsule by mouth daily.       No Known Allergies  Consultations:  Gastroenterology   Procedures/Studies: Dg Chest 2 View  Result Date: 03/13/2018 CLINICAL DATA:  Generalized weakness with nausea and vomiting. EXAM: CHEST - 2 VIEW COMPARISON:  03/12/2018, 05/18/2014 and CT 04/29/2005 FINDINGS: Lungs are hypoinflated without effusion or pneumothorax. There is increased density over the posterior lung bases on the lateral film unchanged and likely due to patient's known moderate size hiatal hernia. Cardiomediastinal silhouette and remainder of the exam is unchanged. IMPRESSION: Hypoinflation without acute cardiopulmonary disease. Hiatal hernia. Electronically Signed   By: Marin Olp M.D.   On: 03/13/2018 09:22   Dg Chest 2 View  Result Date: 03/12/2018 CLINICAL DATA:  Generalized weakness. EXAM: CHEST - 2 VIEW COMPARISON:  May 18, 2014 FINDINGS: The heart size and mediastinal contours are within normal limits. Both lungs are clear. The visualized skeletal structures are unremarkable. IMPRESSION: No active cardiopulmonary disease. Electronically Signed   By: Dorise Bullion III M.D   On: 03/12/2018 23:50   Ct Head Wo Contrast  Result Date: 03/13/2018 CLINICAL DATA:  Altered mental status EXAM: CT HEAD WITHOUT CONTRAST TECHNIQUE: Contiguous axial images were obtained from the base of the skull through the vertex without intravenous contrast. COMPARISON:  01/06/2011 FINDINGS: Brain: There is no mass, hemorrhage or extra-axial collection. The size and configuration of the ventricles and extra-axial CSF spaces are normal. The brain parenchyma is normal, without evidence of acute or chronic  infarction. Vascular: No abnormal hyperdensity of the major intracranial arteries or dural venous sinuses. No intracranial atherosclerosis. Skull: The visualized skull base, calvarium and extracranial soft tissues are normal. Sinuses/Orbits: No fluid levels or advanced mucosal thickening of the visualized paranasal sinuses. No mastoid or middle ear effusion. The orbits are normal. IMPRESSION: Normal head CT. Electronically Signed   By: Ulyses Jarred M.D.   On: 03/13/2018 00:48       Subjective: Feeling well. No melena or hematochezia.  Appetite fine.  No abdominal pain, vomiting.  No confusion, weakness.    Discharge Exam: Vitals:   03/15/18 1020 03/15/18 1040  BP: (!) 117/43 (!) 110/40  Pulse: 73 67  Resp: 14 14  Temp:    SpO2: 91% 92%   Vitals:   03/15/18 1000 03/15/18 1010 03/15/18 1020 03/15/18 1040  BP: (!) 130/45 (!) 108/46 (!) 117/43 (!) 110/40  Pulse: 77 87 73 67  Resp: 19 13 14 14   Temp:      TempSrc:      SpO2: 94% 94% 91% 92%  Weight:      Height:  General: Pt is alert, awake, not in acute distress, walking around room Cardiovascular: RRR, nl S1-S2, no murmurs appreciated.   No LE edema.   Respiratory: Normal respiratory rate and rhythm.  CTAB without rales or wheezes. Abdominal: Abdomen soft and non-tender.  No distension or HSM.   Neuro/Psych: Strength symmetric in upper and lower extremities.  Judgment and insight appear normal.   The results of significant diagnostics from this hospitalization (including imaging, microbiology, ancillary and laboratory) are listed below for reference.     Microbiology: Recent Results (from the past 240 hour(s))  MRSA PCR Screening     Status: None   Collection Time: 03/13/18 10:47 AM  Result Value Ref Range Status   MRSA by PCR NEGATIVE NEGATIVE Final    Comment:        The GeneXpert MRSA Assay (FDA approved for NASAL specimens only), is one component of a comprehensive MRSA colonization surveillance program. It  is not intended to diagnose MRSA infection nor to guide or monitor treatment for MRSA infections. Performed at [facility], [place] [address], [place], [place] [address]      Labs: BNP (last 3 results) No results for input(s): BNP in the last 8760 hours. Basic Metabolic Panel: Recent Labs  Lab 03/12/18 2320 03/14/18 0259 03/15/18 0619  NA 137 138 141  K 3.8 3.3* 3.4*  CL 104 107 108  CO2 22 24 25   GLUCOSE 228* 101* 154*  BUN 18 11 5*  CREATININE 0.81 0.82 0.82  CALCIUM 8.6* 8.2* 9.1   Liver Function Tests: Recent Labs  Lab 03/12/18 2320  AST 23  ALT 10  ALKPHOS 56  BILITOT 0.6  PROT 6.6  ALBUMIN 3.0*   Recent Labs  Lab 03/12/18 2320  LIPASE 23   Recent Labs  Lab 03/12/18 2321  AMMONIA <9*   CBC: Recent Labs  Lab 03/12/18 2320 03/13/18 1116 03/14/18 0259 03/15/18 0619  WBC 8.8 9.7 8.8 8.0  NEUTROABS 7.2  --   --   --   HGB 4.1* 7.2* 7.7* 8.9*  HCT 16.5* 25.5* 26.1* 30.6*  MCV 55.6* 66.4* 68.1* 68.3*  PLT 347 291 231 251   Cardiac Enzymes: Recent Labs  Lab 03/12/18 2320  TROPONINI <0.03   BNP: Invalid input(s): POCBNP CBG: Recent Labs  Lab 03/14/18 1100 03/14/18 1634 03/14/18 2130 03/15/18 0738 03/15/18 1153  GLUCAP 111* 186* 113* 135* 104*   D-Dimer No results for input(s): DDIMER in the last 72 hours. Hgb A1c No results for input(s): HGBA1C in the last 72 hours. Lipid Profile No results for input(s): CHOL, HDL, LDLCALC, TRIG, CHOLHDL, LDLDIRECT in the last 72 hours. Thyroid function studies No results for input(s): TSH, T4TOTAL, T3FREE, THYROIDAB in the last 72 hours.  Invalid input(s): FREET3 Anemia work up Recent Labs    03/13/18 1116  VITAMINB12 140*  FOLATE 11.7  FERRITIN 2*  TIBC 510*  IRON 24*  RETICCTPCT 0.2*   Urinalysis    Component Value Date/Time   COLORURINE YELLOW 03/14/2018 0106   APPEARANCEUR CLEAR 03/14/2018 0106   LABSPEC 1.020 03/14/2018 0106   PHURINE 6.0 03/14/2018 0106    GLUCOSEU 250 (A) 03/14/2018 0106   HGBUR NEGATIVE 03/14/2018 0106   BILIRUBINUR NEGATIVE 03/14/2018 0106   KETONESUR NEGATIVE 03/14/2018 0106   PROTEINUR NEGATIVE 03/14/2018 0106   NITRITE NEGATIVE 03/14/2018 0106   LEUKOCYTESUR NEGATIVE 03/14/2018 0106   Sepsis Labs Invalid input(s): PROCALCITONIN,  WBC,  LACTICIDVEN Microbiology Recent Results (from the past 240 hour(s))  MRSA  PCR Screening     Status: None   Collection Time: 03/13/18 10:47 AM  Result Value Ref Range Status   MRSA by PCR NEGATIVE NEGATIVE Final    Comment:        The GeneXpert MRSA Assay (FDA approved for NASAL specimens only), is one component of a comprehensive MRSA colonization surveillance program. It is not intended to diagnose MRSA infection nor to guide or monitor treatment for MRSA infections. Performed at [facility], [place] [address], [place], [place] [address]      Time coordinating discharge: 40 minutes       SIGNED:   Edwin Dada, MD  [facility] 03/15/2018, 1:22 PM

## 2018-03-15 NOTE — Interval H&P Note (Signed)
History and Physical Interval Note:  40/07/7094 4:38 AM  [name]  has presented today for surgery, with the diagnosis of iron deficiency anemia  The various methods of treatment have been discussed with the patient and family. After consideration of risks, benefits and other options for treatment, the patient has consented to  Procedure(s): COLONOSCOPY WITH PROPOFOL (N/A) ESOPHAGOGASTRODUODENOSCOPY (EGD) WITH PROPOFOL (N/A) as a surgical intervention .  The patient's history has been reviewed, patient examined, no change in status, stable for surgery.  I have reviewed the patient's chart and labs.  Questions were answered to the patient's satisfaction.     Milus Banister

## 2018-03-15 NOTE — Progress Notes (Signed)
Call made to son, Erlene Quan to make sure he is coming to pick patient up, as she has been [place]. He states that he is en route now. Donne Hazel, RN

## 2018-03-15 NOTE — Care Management Note (Signed)
Case Management Note  Patient Details  Name: [name] MRN: [id number] Date of Birth: [birth year]  Subjective/Objective:                  Discharged  Action/Plan: Discharged to home with self-care, orders checked for hhc needs. No CM needs present at time of discharge.  Expected Discharge Date:  03/15/18               Expected Discharge Plan:  Home/Self Care  In-House Referral:     Discharge planning Services  CM Consult  Post Acute Care Choice:    Choice offered to:     DME Arranged:    DME Agency:     HH Arranged:    HH Agency:     Status of Service:  Completed, signed off  If discussed at [employer] of Stay Meetings, dates discussed:    Additional Comments:  Leeroy Cha, RN 03/15/2018, 2:46 PM

## 2018-03-15 NOTE — Op Note (Signed)
[facility] Patient Name: [name] Procedure Date: 03/15/2018 MRN: [id number] Attending MD: Milus Banister , MD Date of Birth: 1950-09-07 CSN: [id number] Age: 67 Admit Type: Inpatient Procedure:                Upper GI endoscopy Indications:              Iron deficiency anemia, severe (Hb 4.1 at admit),                            no overt bleeding but hemocult + stool Providers:                Milus Banister, MD, Debi Mays, RN, Elspeth Cho                            Tech., Technician Referring MD:              Medicines:                Fentanyl 50 micrograms IV, Midazolam 4 mg IV Complications:            No immediate complications. Estimated blood loss:                            None. Estimated Blood Loss:     Estimated blood loss: none. Procedure:                Pre-Anesthesia Assessment:                           - Prior to the procedure, a History and Physical                            was performed, and patient medications and                            allergies were reviewed. The patient's tolerance of                            previous anesthesia was also reviewed. The risks                            and benefits of the procedure and the sedation                            options and risks were discussed with the patient.                            All questions were answered, and informed consent                            was obtained. Prior Anticoagulants: The patient has                            taken no previous anticoagulant or antiplatelet  agents. ASA Grade Assessment: III - A patient with                            severe systemic disease. After reviewing the risks                            and benefits, the patient was deemed in                            satisfactory condition to undergo the procedure.                           After obtaining informed consent, the endoscope was   passed under direct vision. Throughout the                            procedure, the patient's blood pressure, pulse, and                            oxygen saturations were monitored continuously. The                            GIF-H190 ([id number]) Olympus adult endoscope was                            introduced through the mouth, and advanced to the                            second part of duodenum. The upper GI endoscopy was                            accomplished without difficulty. The patient                            tolerated the procedure well. Scope In: Scope Out: Findings:      Non-obstructing, minor Schatzki's ring at the GE junction.      One non-bleeding cratered gastric ulcer with no stigmata of bleeding was       found in the gastric body and on the lesser curvature of the stomach.       The lesion was 15 mm in largest dimension. The mucosa surrounding the       ulcer was edematous, not clearly neoplastic was biopsies were taken with       a cold forceps for histology. jar 2      Mild, non-specific distal gastritis, biopsied. jar 3      Normal duodenum, biopsied. jar 1      The exam was otherwise without abnormality. Impression:               - Non-obstructing, minor Schatzki's ring at the GE                            junction.                           - One non-bleeding cratered  gastric ulcer with no                            stigmata of bleeding was found in the gastric body                            and on the lesser curvature of the stomach. The                            lesion was 15 mm in largest dimension. The mucosa                            surrounding the ulcer was edematous, not clearly                            neoplastic was biopsies were taken with a cold                            forceps for histology. This is likely the cause of                            her anemia and it may be due to daily aspirin. It                            appears to be at  low risk for further bleeding.                            Biopsies taken from the mucosa surrounding the                            ulcer to check for neoplasm. Biopsies taken from                            the distal stomach to check for H. pylori. Biopsies                            taken from normal duodenum to check for Celiac Sprue Moderate Sedation:      Moderate (conscious) sedation was administered by the endoscopy nurse       and supervised by the endoscopist. The patient's oxygen saturation,       heart rate, blood pressure and response to care were monitored. Total       physician intraservice time was 18 minutes. Recommendation:           - Return patient to hospital ward for possible                            discharge same day.                           - She will need to be on twice daily PPI for now.  She should only take NSAIDs or aspirin if medically                            indicated (can probably stop the daily baby                            aspirin). She should stay on once daily OTC iron                            supplment. She will need repeat EGD in 2 months to                            check for ulcer healing. I will contact her with                            the path results and arrange follow up from that                            point.                           - Please call, page with any further questions or                            concerns. Procedure Code(s):        --- Professional ---                           [phone], Esophagogastroduodenoscopy, flexible,                            transoral; with biopsy, single or multiple                           G0500, Moderate sedation services provided by the                            same physician or other qualified health care                            professional performing a gastrointestinal                            endoscopic service that sedation supports,                             requiring the presence of an independent trained                            observer to assist in the monitoring of the                            patient's level of consciousness and physiological  status; initial 15 minutes of intra-service time;                            patient age 11 years or older (additional time may                            be reported with [phone], as appropriate) Diagnosis Code(s):        --- Professional ---                           K25.9, Gastric ulcer, unspecified as acute or                            chronic, without hemorrhage or perforation                           D50.9, Iron deficiency anemia, unspecified CPT copyright 2018 [employer]. All rights reserved. The codes documented in this report are preliminary and upon coder review may  be revised to meet current compliance requirements. Milus Banister, MD 03/15/2018 10:00:56 AM This report has been signed electronically. Number of Addenda: 0

## 2018-03-15 NOTE — Progress Notes (Signed)
This shift pt was not given 1st dose of prep until 1945.  Pt stated that she completed but no outcome reported to staff.pt refused any more prep  and went to sleep. Pt made aware of importance of prep intake. Next dose  On MAR at 5am. Pt worked on 2 cups and had moderate output within 45 minutes dark and clumpy. At writing pt still working on completion of prep. Will continue to monitor and relay outcome to oncoming RN

## 2018-03-16 ENCOUNTER — Encounter (HOSPITAL_COMMUNITY): Payer: Self-pay | Admitting: Gastroenterology

## 2018-03-27 ENCOUNTER — Other Ambulatory Visit: Payer: Self-pay

## 2018-03-27 DIAGNOSIS — K921 Melena: Secondary | ICD-10-CM

## 2018-03-28 ENCOUNTER — Encounter: Payer: Self-pay | Admitting: Physician Assistant

## 2018-03-28 ENCOUNTER — Other Ambulatory Visit: Payer: Self-pay

## 2018-03-28 ENCOUNTER — Ambulatory Visit (INDEPENDENT_AMBULATORY_CARE_PROVIDER_SITE_OTHER): Payer: Medicare (Managed Care) | Admitting: Physician Assistant

## 2018-03-28 ENCOUNTER — Telehealth: Payer: Self-pay

## 2018-03-28 VITALS — BP 130/60 | HR 75 | Ht 63.0 in | Wt 161.0 lb

## 2018-03-28 DIAGNOSIS — K250 Acute gastric ulcer with hemorrhage: Secondary | ICD-10-CM

## 2018-03-28 DIAGNOSIS — D509 Iron deficiency anemia, unspecified: Secondary | ICD-10-CM | POA: Diagnosis not present

## 2018-03-28 NOTE — Progress Notes (Signed)
Chief Complaint: Follow-up hospitalization for microcytic anemia and heme positive stool  HPI:    [name] is a 67 year old female with a past medical history as listed below, assigned to Dr. Ardis Hughs at recent hospitalization, who presents to clinic today for follow-up after being seen by our service in the hospital for microcytic anemia and heme positive stool.    03/13/2018 consulted by our service for profound microcytic anemia and heme positive stool.  At time of admission was found to have a hemoglobin of 4.1.  Underwent EGD and colonoscopy 03/15/2018.  Colonoscopy showed the entire examined colon was normal, no polyps or cancers.  Repeat recommended in 10 years.  EGD showed nonobstructing, minor Schatzki's ring and one nonbleeding cratered gastric ulcer with no stigmata of bleeding in the gastric body and on the lesser curvature of the stomach, lesion was 15 mm in largest dimension, mucosa surrounding the ulcer was edematous, not clearly neoplastic and biopsies were taken.  It was thought this is likely the cause of her anemia and may be due to daily aspirin.  Pathology showed no signs of infection, cancer or celiac sprue.  Recommend she continue twice daily PPI, strictly avoid NSAIDs/aspirin and take a single over-the-counter iron supplement once daily.  It was recommended she have a repeat CBC in 3 weeks.    Today, the patient presents clinic and explains that she is feeling well.  She has seen no further dark stools and has been taking her iron supplementation as well as Pantoprazole twice a day.  No new complaints.    Denies fever, chills, weight loss or abdominal pain.  Past Medical History:  Diagnosis Date  . Anemia   . Cognitive impairment    lifelong  . Diabetes mellitus   . Hyperlipidemia     Past Surgical History:  Procedure Laterality Date  . BIOPSY  03/15/2018   Procedure: BIOPSY;  Surgeon: Milus Banister, MD;  Location: [facility];  Service: Endoscopy;;  . COLONOSCOPY  WITH PROPOFOL N/A 03/15/2018   Procedure: COLONOSCOPY WITH PROPOFOL;  Surgeon: Milus Banister, MD;  Location: [facility];  Service: Endoscopy;  Laterality: N/A;  . ESOPHAGOGASTRODUODENOSCOPY (EGD) WITH PROPOFOL N/A 03/15/2018   Procedure: ESOPHAGOGASTRODUODENOSCOPY (EGD) WITH PROPOFOL;  Surgeon: Milus Banister, MD;  Location: [facility];  Service: Endoscopy;  Laterality: N/A;  . NO PAST SURGERIES      Current Outpatient Medications  Medication Sig Dispense Refill  . Cholecalciferol (VITAMIN D3) 1000 units CAPS Take 1 capsule by mouth daily.    Marland Kitchen docusate sodium (COLACE) 100 MG capsule Take 1 capsule (100 mg total) by mouth daily. 30 capsule 2  . ferrous sulfate 325 (65 FE) MG EC tablet Take 1 tablet (325 mg total) by mouth daily with breakfast. 60 tablet 3  . metFORMIN (GLUCOPHAGE) 1000 MG tablet Take 1,000 mg by mouth 2 (two) times daily with a meal.    . pantoprazole (PROTONIX) 40 MG tablet Take 1 tablet (40 mg total) by mouth 2 (two) times daily before a meal. 60 tablet 3  . pravastatin (PRAVACHOL) 20 MG tablet Take 20 mg by mouth daily.     No current facility-administered medications for this visit.     Allergies as of 03/28/2018  . (No Known Allergies)    Family History  Problem Relation Age of Onset  . Breast cancer Neg Hx     Social History   Socioeconomic History  . Marital status: Single    Spouse name: Not on file  .  Number of children: Not on file  . Years of education: Not on file  . Highest education level: Not on file  Occupational History  . Not on file  Social Needs  . Financial resource strain: Not on file  . Food insecurity:    Worry: Not on file    Inability: Not on file  . Transportation needs:    Medical: Not on file    Non-medical: Not on file  Tobacco Use  . Smoking status: Never Smoker  . Smokeless tobacco: Never Used  Substance and Sexual Activity  . Alcohol use: No    Alcohol/week: 0.0 standard drinks  . Drug use: No  . Sexual  activity: Not on file  Lifestyle  . Physical activity:    Days per week: Not on file    Minutes per session: Not on file  . Stress: Not on file  Relationships  . Social connections:    Talks on phone: Not on file    Gets together: Not on file    Attends religious service: Not on file    Active member of club or organization: Not on file    Attends meetings of clubs or organizations: Not on file    Relationship status: Not on file  . Intimate partner violence:    Fear of current or ex partner: Not on file    Emotionally abused: Not on file    Physically abused: Not on file    Forced sexual activity: Not on file  Other Topics Concern  . Not on file  Social History Narrative  . Not on file    Review of Systems:    Constitutional: No weight loss, fever or chills Cardiovascular: No chest pain Respiratory: No SOB  Gastrointestinal: See HPI and otherwise negative   Physical Exam:  Vital signs: BP 130/60   Pulse 75   Ht 5\' 3"  (1.6 m)   Wt 161 lb (73 kg)   BMI 28.52 kg/m   Constitutional:   Pleasant AA female appears to be in NAD, Well developed, Well nourished, alert and cooperative Respiratory: Respirations even and unlabored. Lungs clear to auscultation bilaterally.   No wheezes, crackles, or rhonchi.  Cardiovascular: Normal S1, S2. No MRG. Regular rate and rhythm. No peripheral edema, cyanosis or pallor.  Gastrointestinal:  Soft, nondistended, nontender. No rebound or guarding. Normal bowel sounds. No appreciable masses or hepatomegaly. Psychiatric: Demonstrates good judgement and reason without abnormal affect or behaviors.   RELEVANT LABS AND IMAGING: CBC    Component Value Date/Time   WBC 8.0 03/15/2018 0619   RBC 4.48 03/15/2018 0619   HGB 8.9 (L) 03/15/2018 0619   HCT 30.6 (L) 03/15/2018 0619   PLT 251 03/15/2018 0619   MCV 68.3 (L) 03/15/2018 0619   MCH 19.9 (L) 03/15/2018 0619   MCHC 29.1 (L) 03/15/2018 0619   RDW Not Measured 03/15/2018 0619   LYMPHSABS  1.0 03/12/2018 2320   MONOABS 0.5 03/12/2018 2320   EOSABS 0.0 03/12/2018 2320   BASOSABS 0.0 03/12/2018 2320    CMP     Component Value Date/Time   NA 141 03/15/2018 0619   K 3.4 (L) 03/15/2018 0619   CL 108 03/15/2018 0619   CO2 25 03/15/2018 0619   GLUCOSE 154 (H) 03/15/2018 0619   BUN 5 (L) 03/15/2018 0619   CREATININE 0.82 03/15/2018 0619   CALCIUM 9.1 03/15/2018 0619   PROT 6.6 03/12/2018 2320   ALBUMIN 3.0 (L) 03/12/2018 2320   AST 23 03/12/2018 2320  ALT 10 03/12/2018 2320   ALKPHOS 56 03/12/2018 2320   BILITOT 0.6 03/12/2018 2320   GFRNONAA >60 03/15/2018 0619   GFRAA >60 03/15/2018 9323    Assessment: 1.  Gastric ulcer: Seen at time of EGD on 03/15/2018, cause of anemia 2.  Anemia: Related to above  Plan: 1.  We will repeat CBC in 2 weeks.  Discussed with the patient that she will not need to go to the lab.  She will get a call from our office for reminder. 2.  Continue Pantoprazole 40 mg twice daily. 3.  Continue iron supplementation once daily. 4.  We will double check with Dr. Ardis Hughs to make sure he does not want a repeat EGD to ensure healing of the ulcer. 5.  Patient will follow in clinic as needed with Dr. Ardis Hughs or myself in the future.  Ellouise Newer, PA-C [facility] 03/28/2018, 9:40 AM  Cc: Janifer Adie, MD

## 2018-03-28 NOTE — Telephone Encounter (Signed)
Recall in Epic to call pt for EGD in 6 weeks.  Schedule not out at this time.

## 2018-03-28 NOTE — Telephone Encounter (Signed)
-----   Message from Levin Erp, [place] sent at 03/28/2018 11:36 AM EST ----- Regarding: FW: Did you want repeat EGD? Can you please arrange for patient to have repeat EGD due to gastric ulcer in 6 weeks with Dr. Ardis Hughs in [facility].  Thanks-JLL  ----- Message ----- From: Milus Banister, MD Sent: 03/28/2018  10:53 AM EST To: Levin Erp, PA Subject: RE: Did you want repeat EGD?                   Yes, thanks for checking   ----- Message ----- From: Levin Erp, PA Sent: 03/28/2018   9:57 AM EST To: Milus Banister, MD Subject: Did you want repeat EGD?                       I didn't see it mentioned in procedure note, but do you want repeat EGD to ensure healing? Thanks-JLL

## 2018-03-28 NOTE — Patient Instructions (Addendum)
Continue current medications.  Your provider has requested that you go to the basement level for lab work 04/17/18. Press "B" on the elevator. The lab is located at the first door on the left as you exit the elevator.  If you are age 66 or older, your body mass index should be between 23-30. Your Body mass index is 28.52 kg/m. If this is out of the aforementioned range listed, please consider follow up with your Primary Care Provider.  If you are age 75 or younger, your body mass index should be between 19-25. Your Body mass index is 28.52 kg/m. If this is out of the aformentioned range listed, please consider follow up with your Primary Care Provider.

## 2018-03-28 NOTE — Progress Notes (Signed)
I agree with the above note, plan.  She does need repeat EGD (early January) to check for gastric ulcer healing. In the meantime strictly avoid NSAIDs and ASA, BID PPI and take iron supplement once daily.  thanks

## 2018-04-11 ENCOUNTER — Encounter: Payer: Self-pay | Admitting: Gastroenterology

## 2018-05-17 ENCOUNTER — Ambulatory Visit (AMBULATORY_SURGERY_CENTER): Payer: Self-pay | Admitting: *Deleted

## 2018-05-17 VITALS — Ht 63.0 in | Wt 165.0 lb

## 2018-05-17 DIAGNOSIS — K253 Acute gastric ulcer without hemorrhage or perforation: Secondary | ICD-10-CM

## 2018-05-17 NOTE — Progress Notes (Signed)
No egg or soy allergy known to patient  No issues with past sedation with any surgeries  or procedures, no intubation problems  No diet pills per patient No home 02 use per patient  No blood thinners per patient  Pt denies issues with constipation  No A fib or A flutter  EMMI video sent to pt's e mail - pt declined - pt has no e mail - no computer  Pt states someone from [place] will accompany her the day of her EGD and wait here the whole time

## 2018-05-31 ENCOUNTER — Encounter: Payer: Medicare (Managed Care) | Admitting: Gastroenterology

## 2018-05-31 ENCOUNTER — Telehealth: Payer: Self-pay | Admitting: Gastroenterology

## 2018-05-31 NOTE — Telephone Encounter (Signed)
[place], thanks.  Should be charged for last minute cancellation per policy

## 2018-05-31 NOTE — Telephone Encounter (Signed)
Canceled pt's endo for this morning.  Pt was dropped off at our office and Kennyth Lose from Dr. Etheleen Sia office called and said she does not have a care partner to sit with her. Per Kennyth Lose she wanted to reschedule endo until 06/13/2018 and they will arrange a care partner to attend the procedure

## 2018-06-13 ENCOUNTER — Encounter: Payer: Medicare (Managed Care) | Admitting: Gastroenterology

## 2018-06-20 ENCOUNTER — Encounter: Payer: Self-pay | Admitting: Gastroenterology

## 2018-06-21 ENCOUNTER — Encounter: Payer: Self-pay | Admitting: Gastroenterology

## 2018-06-21 ENCOUNTER — Ambulatory Visit (AMBULATORY_SURGERY_CENTER): Payer: Medicare (Managed Care) | Admitting: Gastroenterology

## 2018-06-21 VITALS — BP 128/64 | HR 82 | Temp 97.5°F | Resp 15 | Ht 63.0 in | Wt 165.0 lb

## 2018-06-21 DIAGNOSIS — Z8711 Personal history of peptic ulcer disease: Secondary | ICD-10-CM

## 2018-06-21 DIAGNOSIS — K449 Diaphragmatic hernia without obstruction or gangrene: Secondary | ICD-10-CM | POA: Diagnosis not present

## 2018-06-21 DIAGNOSIS — K253 Acute gastric ulcer without hemorrhage or perforation: Secondary | ICD-10-CM

## 2018-06-21 MED ORDER — SODIUM CHLORIDE 0.9 % IV SOLN: 500.0000 mL | Freq: Once | INTRAVENOUS | Status: DC

## 2018-06-21 NOTE — Op Note (Signed)
[place] Patient Name: [name] Procedure Date: 06/21/2018 3:24 PM MRN: [id number] Endoscopist: Milus Banister , MD Age: 68 Referring MD:  Date of Birth: 26-Oct-1950 Gender: Female Account #: [id number] Procedure:                Upper GI endoscopy Indications:              Follow-up of gastric ulcer noted 03/2018 during EGD                            (path showed no H. pylori, suspected NSAID related) Medicines:                Monitored Anesthesia Care Procedure:                Pre-Anesthesia Assessment:                           - Prior to the procedure, a History and Physical                            was performed, and patient medications and                            allergies were reviewed. The patient's tolerance of                            previous anesthesia was also reviewed. The risks                            and benefits of the procedure and the sedation                            options and risks were discussed with the patient.                            All questions were answered, and informed consent                            was obtained. Prior Anticoagulants: The patient has                            taken no previous anticoagulant or antiplatelet                            agents. ASA Grade Assessment: III - A patient with                            severe systemic disease. After reviewing the risks                            and benefits, the patient was deemed in                            satisfactory condition to undergo the procedure.  After obtaining informed consent, the endoscope was                            passed under direct vision. Throughout the                            procedure, the patient's blood pressure, pulse, and                            oxygen saturations were monitored continuously. The                            Endoscope was introduced through the mouth, and   advanced to the second part of duodenum. The upper                            GI endoscopy was accomplished without difficulty.                            The patient tolerated the procedure well. Scope In: Scope Out: Findings:                 The previously noted gastric ulcer has healed.                           A medium-sized hiatal hernia was present.                           The exam was otherwise without abnormality. Complications:            No immediate complications. Estimated blood loss:                            None. Estimated Blood Loss:     Estimated blood loss: none. Impression:               - Medium-sized hiatal hernia.                           - The previously noted gastric ulcer has healed.                           - No specimens collected. Recommendation:           - Patient has a contact number available for                            emergencies. The signs and symptoms of potential                            delayed complications were discussed with the                            patient. Return to normal activities tomorrow.                            Written discharge instructions were  provided to the                            patient.                           - Resume previous diet.                           - Continue present medications. Iron supplement                            once daily. PPI once daily. Avoid NSAIDs strictly. Milus Banister, MD 06/21/2018 3:57:43 PM This report has been signed electronically.

## 2018-06-21 NOTE — Patient Instructions (Signed)
YOU HAD AN ENDOSCOPIC PROCEDURE TODAY AT [facility]:   Refer to the procedure report that was given to you for any specific questions about what was found during the examination.  If the procedure report does not answer your questions, please call your gastroenterologist to clarify.  If you requested that your care partner not be given the details of your procedure findings, then the procedure report has been included in a sealed envelope for you to review at your convenience later.  YOU SHOULD EXPECT: Some feelings of bloating in the abdomen. Passage of more gas than usual.  Walking can help get rid of the air that was put into your GI tract during the procedure and reduce the bloating. If you had a lower endoscopy (such as a colonoscopy or flexible sigmoidoscopy) you may notice spotting of blood in your stool or on the toilet paper. If you underwent a bowel prep for your procedure, you may not have a normal bowel movement for a few days.  Please Note:  You might notice some irritation and congestion in your nose or some drainage.  This is from the oxygen used during your procedure.  There is no need for concern and it should clear up in a day or so.  SYMPTOMS TO REPORT IMMEDIATELY:    Following upper endoscopy (EGD)  Vomiting of blood or coffee ground material  New chest pain or pain under the shoulder blades  Painful or persistently difficult swallowing  New shortness of breath  Fever of 100F or higher  Black, tarry-looking stools  For urgent or emergent issues, a gastroenterologist can be reached at any hour by calling [phone].  No NSAIDS, Iron Supplement once a day.    DIET:  We do recommend a small meal at first, but then you may proceed to your regular diet.  Drink plenty of fluids but you should avoid alcoholic beverages for 24 hours.  ACTIVITY:  You should plan to take it easy for the rest of today and you should NOT DRIVE or use heavy machinery until  tomorrow (because of the sedation medicines used during the test).    FOLLOW UP: Our staff will call the number listed on your records the next business day following your procedure to check on you and address any questions or concerns that you may have regarding the information given to you following your procedure. If we do not reach you, we will leave a message.  However, if you are feeling well and you are not experiencing any problems, there is no need to return our call.  We will assume that you have returned to your regular daily activities without incident.  If any biopsies were taken you will be contacted by phone or by letter within the next 1-3 weeks.  Please call us at [phone] if you have not heard about the biopsies in 3 weeks.    SIGNATURES/CONFIDENTIALITY: You and/or your care partner have signed paperwork which will be entered into your electronic medical record.  These signatures attest to the fact that that the information above on your After Visit Summary has been reviewed and is understood.  Full responsibility of the confidentiality of this discharge information lies with you and/or your care-partner.  Thank you for letting us take care of your healthcare needs today.

## 2018-06-21 NOTE — Progress Notes (Signed)
Pt's states no medical or surgical changes since previsit or office visit. 

## 2018-06-21 NOTE — Progress Notes (Signed)
Upon egd entering stomach, pt had food in stomach.  Sedation stopped and dr j aware

## 2018-06-21 NOTE — Progress Notes (Signed)
Report to PACU, RN, vss, BBS= Clear.  

## 2018-06-22 ENCOUNTER — Telehealth: Payer: Self-pay | Admitting: *Deleted

## 2018-06-22 NOTE — Telephone Encounter (Signed)
  Follow up Call-  Call back number 06/21/2018  Post procedure Call Back phone  # [phone] her sons cell number ok to leave messgae.  Permission to leave phone message Yes  Some recent data might be hidden    Spoke with son Patient questions:  Do you have a fever, pain , or abdominal swelling? No. Pain Score  0 *  Have you tolerated food without any problems? Yes.    Have you been able to return to your normal activities? Yes.    Do you have any questions about your discharge instructions: Diet   No. Medications  No. Follow up visit  No.  Do you have questions or concerns about your Care? No.  Actions: * If pain score is 4 or above: No action needed, pain <4.

## 2021-01-26 ENCOUNTER — Other Ambulatory Visit: Payer: Self-pay | Admitting: Vascular Surgery

## 2021-01-26 DIAGNOSIS — Z1231 Encounter for screening mammogram for malignant neoplasm of breast: Secondary | ICD-10-CM

## 2021-02-23 ENCOUNTER — Other Ambulatory Visit: Payer: Self-pay

## 2021-02-23 ENCOUNTER — Ambulatory Visit
Admission: RE | Admit: 2021-02-23 | Discharge: 2021-02-23 | Disposition: A | Discharge status: Home / Self Care | Payer: Medicare (Managed Care) | Source: Ambulatory Visit | Attending: Vascular Surgery | Admitting: Vascular Surgery

## 2021-02-23 DIAGNOSIS — Z1231 Encounter for screening mammogram for malignant neoplasm of breast: Secondary | ICD-10-CM

## 2021-03-03 ENCOUNTER — Other Ambulatory Visit: Payer: Self-pay | Admitting: Vascular Surgery

## 2021-03-03 DIAGNOSIS — Z1231 Encounter for screening mammogram for malignant neoplasm of breast: Secondary | ICD-10-CM

## 2022-02-22 ENCOUNTER — Encounter: Payer: Self-pay | Admitting: Physician Assistant

## 2022-02-23 ENCOUNTER — Other Ambulatory Visit (HOSPITAL_COMMUNITY): Payer: Self-pay | Admitting: *Deleted

## 2022-02-23 DIAGNOSIS — D649 Anemia, unspecified: Secondary | ICD-10-CM

## 2022-02-24 ENCOUNTER — Ambulatory Visit (HOSPITAL_COMMUNITY)
Admission: RE | Admit: 2022-02-24 | Discharge: 2022-02-24 | Disposition: A | Discharge status: Home / Self Care | Payer: Medicare (Managed Care) | Source: Ambulatory Visit | Attending: Family Medicine | Admitting: Family Medicine

## 2022-02-24 DIAGNOSIS — D649 Anemia, unspecified: Secondary | ICD-10-CM | POA: Diagnosis present

## 2022-02-24 LAB — CBC WITH DIFFERENTIAL/PLATELET
Abs Immature Granulocytes: 0.02 10*3/uL (ref 0.00–0.07)
Basophils Absolute: 0.1 10*3/uL (ref 0.0–0.1)
Basophils Relative: 1 %
Eosinophils Absolute: 0.2 10*3/uL (ref 0.0–0.5)
Eosinophils Relative: 4 %
HCT: 25.1 % — ABNORMAL LOW (ref 36.0–46.0)
Hemoglobin: 6.7 g/dL — CL (ref 12.0–15.0)
Immature Granulocytes: 0 %
Lymphocytes Relative: 20 %
Lymphs Abs: 1.3 10*3/uL (ref 0.7–4.0)
MCH: 14.9 pg — ABNORMAL LOW (ref 26.0–34.0)
MCHC: 26.7 g/dL — ABNORMAL LOW (ref 30.0–36.0)
MCV: 55.8 fL — ABNORMAL LOW (ref 80.0–100.0)
Monocytes Absolute: 0.4 10*3/uL (ref 0.1–1.0)
Monocytes Relative: 6 %
Neutro Abs: 4.5 10*3/uL (ref 1.7–7.7)
Neutrophils Relative %: 69 %
Platelets: 238 10*3/uL (ref 150–400)
RBC: 4.5 MIL/uL (ref 3.87–5.11)
RDW: 23 % — ABNORMAL HIGH (ref 11.5–15.5)
WBC: 6.5 10*3/uL (ref 4.0–10.5)
nRBC: 0 % (ref 0.0–0.2)

## 2022-02-24 LAB — PREPARE RBC (CROSSMATCH)

## 2022-02-24 MED ORDER — SODIUM CHLORIDE 0.9% IV SOLUTION: Freq: Once | INTRAVENOUS | Status: DC

## 2022-02-25 ENCOUNTER — Ambulatory Visit
Admission: RE | Admit: 2022-02-25 | Discharge: 2022-02-25 | Disposition: A | Discharge status: Home / Self Care | Payer: Medicare (Managed Care) | Source: Ambulatory Visit | Attending: Vascular Surgery | Admitting: Vascular Surgery

## 2022-02-25 DIAGNOSIS — Z1231 Encounter for screening mammogram for malignant neoplasm of breast: Secondary | ICD-10-CM

## 2022-02-25 LAB — BPAM RBC
Blood Product Expiration Date: 202311192359
Blood Product Expiration Date: 202311192359
ISSUE DATE / TIME: 202310181030
ISSUE DATE / TIME: 202310181244
Unit Type and Rh: 6200
Unit Type and Rh: 6200

## 2022-02-25 LAB — TYPE AND SCREEN
ABO/RH(D): A POS
Antibody Screen: NEGATIVE
Unit division: 0
Unit division: 0

## 2022-03-25 ENCOUNTER — Ambulatory Visit: Payer: Medicare (Managed Care) | Admitting: Physician Assistant

## 2022-11-05 ENCOUNTER — Other Ambulatory Visit: Payer: Self-pay | Admitting: Vascular Surgery

## 2022-11-05 DIAGNOSIS — Z1231 Encounter for screening mammogram for malignant neoplasm of breast: Secondary | ICD-10-CM

## 2023-01-05 ENCOUNTER — Other Ambulatory Visit: Payer: Self-pay | Admitting: Vascular Surgery

## 2023-01-05 DIAGNOSIS — E1151 Type 2 diabetes mellitus with diabetic peripheral angiopathy without gangrene: Secondary | ICD-10-CM

## 2023-01-11 ENCOUNTER — Ambulatory Visit: Payer: Medicare (Managed Care)

## 2023-01-20 ENCOUNTER — Emergency Department (HOSPITAL_COMMUNITY)
Admission: EM | Admit: 2023-01-20 | Discharge: 2023-01-20 | Disposition: A | Discharge status: Home / Self Care | Payer: Medicare (Managed Care) | Attending: Emergency Medicine | Admitting: Emergency Medicine

## 2023-01-20 ENCOUNTER — Other Ambulatory Visit: Payer: Self-pay

## 2023-01-20 ENCOUNTER — Emergency Department (HOSPITAL_COMMUNITY): Payer: Medicare (Managed Care)

## 2023-01-20 ENCOUNTER — Encounter (HOSPITAL_COMMUNITY): Payer: Self-pay

## 2023-01-20 DIAGNOSIS — M79605 Pain in left leg: Secondary | ICD-10-CM | POA: Insufficient documentation

## 2023-01-20 DIAGNOSIS — W19XXXA Unspecified fall, initial encounter: Secondary | ICD-10-CM

## 2023-01-20 DIAGNOSIS — E119 Type 2 diabetes mellitus without complications: Secondary | ICD-10-CM | POA: Diagnosis not present

## 2023-01-20 DIAGNOSIS — E860 Dehydration: Secondary | ICD-10-CM | POA: Insufficient documentation

## 2023-01-20 DIAGNOSIS — I10 Essential (primary) hypertension: Secondary | ICD-10-CM | POA: Insufficient documentation

## 2023-01-20 DIAGNOSIS — W010XXA Fall on same level from slipping, tripping and stumbling without subsequent striking against object, initial encounter: Secondary | ICD-10-CM | POA: Diagnosis not present

## 2023-01-20 DIAGNOSIS — Z79899 Other long term (current) drug therapy: Secondary | ICD-10-CM | POA: Insufficient documentation

## 2023-01-20 DIAGNOSIS — Z7984 Long term (current) use of oral hypoglycemic drugs: Secondary | ICD-10-CM | POA: Insufficient documentation

## 2023-01-20 DIAGNOSIS — R4182 Altered mental status, unspecified: Secondary | ICD-10-CM | POA: Diagnosis present

## 2023-01-20 DIAGNOSIS — M79604 Pain in right leg: Secondary | ICD-10-CM | POA: Insufficient documentation

## 2023-01-20 LAB — COMPREHENSIVE METABOLIC PANEL
ALT: 20 U/L (ref 0–44)
AST: 25 U/L (ref 15–41)
Albumin: 3.7 g/dL (ref 3.5–5.0)
Alkaline Phosphatase: 52 U/L (ref 38–126)
Anion gap: 13 (ref 5–15)
BUN: 13 mg/dL (ref 8–23)
CO2: 19 mmol/L — ABNORMAL LOW (ref 22–32)
Calcium: 9 mg/dL (ref 8.9–10.3)
Chloride: 101 mmol/L (ref 98–111)
Creatinine, Ser: 1.12 mg/dL — ABNORMAL HIGH (ref 0.44–1.00)
GFR, Estimated: 53 mL/min — ABNORMAL LOW (ref >60)
Glucose, Bld: 194 mg/dL — ABNORMAL HIGH (ref 70–99)
Potassium: 4.7 mmol/L (ref 3.5–5.1)
Sodium: 133 mmol/L — ABNORMAL LOW (ref 135–145)
Total Bilirubin: 0.5 mg/dL (ref 0.3–1.2)
Total Protein: 6.8 g/dL (ref 6.5–8.1)

## 2023-01-20 LAB — CBC WITH DIFFERENTIAL/PLATELET
Abs Immature Granulocytes: 0.04 10*3/uL (ref 0.00–0.07)
Basophils Absolute: 0 10*3/uL (ref 0.0–0.1)
Basophils Relative: 0 %
Eosinophils Absolute: 0 10*3/uL (ref 0.0–0.5)
Eosinophils Relative: 0 %
HCT: 39.4 % (ref 36.0–46.0)
Hemoglobin: 11.8 g/dL — ABNORMAL LOW (ref 12.0–15.0)
Immature Granulocytes: 0 %
Lymphocytes Relative: 10 %
Lymphs Abs: 0.9 10*3/uL (ref 0.7–4.0)
MCH: 21.2 pg — ABNORMAL LOW (ref 26.0–34.0)
MCHC: 29.9 g/dL — ABNORMAL LOW (ref 30.0–36.0)
MCV: 70.7 fL — ABNORMAL LOW (ref 80.0–100.0)
Monocytes Absolute: 0.3 10*3/uL (ref 0.1–1.0)
Monocytes Relative: 3 %
Neutro Abs: 8.2 10*3/uL — ABNORMAL HIGH (ref 1.7–7.7)
Neutrophils Relative %: 87 %
Platelets: 213 10*3/uL (ref 150–400)
RBC: 5.57 MIL/uL — ABNORMAL HIGH (ref 3.87–5.11)
RDW: 15.5 % (ref 11.5–15.5)
WBC: 9.5 10*3/uL (ref 4.0–10.5)
nRBC: 0 % (ref 0.0–0.2)

## 2023-01-20 LAB — URINALYSIS, ROUTINE W REFLEX MICROSCOPIC
Bacteria, UA: NONE SEEN
Bilirubin Urine: NEGATIVE
Glucose, UA: NEGATIVE mg/dL
Hgb urine dipstick: NEGATIVE
Ketones, ur: 20 mg/dL — AB
Leukocytes,Ua: NEGATIVE
Nitrite: NEGATIVE
Protein, ur: 30 mg/dL — AB
Specific Gravity, Urine: 1.012 (ref 1.005–1.030)
pH: 5 (ref 5.0–8.0)

## 2023-01-20 LAB — CBG MONITORING, ED: Glucose-Capillary: 181 mg/dL — ABNORMAL HIGH (ref 70–99)

## 2023-01-20 LAB — CK: Total CK: 236 U/L — ABNORMAL HIGH (ref 38–234)

## 2023-01-20 MED ORDER — SODIUM CHLORIDE 0.9 % IV BOLUS
500.0000 mL | Freq: Once | INTRAVENOUS | Status: AC
  Administered 2023-01-20: 500 mL via INTRAVENOUS

## 2023-01-20 NOTE — ED Provider Notes (Signed)
[facility] AT [facility] [address] Provider Note   CSN: [id number] Arrival date & time: 01/20/23  0932     History  Chief Complaint  Patient presents with   Altered Mental Status   Fall    [name] is a 72 y.o. female.  Pt is a 72 yo female with pmhx significant for dm, anemia, cognitive impairment, hld, gastric ulcer, htn, and pvd.  Pt said she tripped over a living room rug and fell.  Pt lives alone.  Pt said she was not able to get up on her own.  Her sister came over and called EMS.  Sister reports that her ms is altered.  Pt has pain in both legs all over legs.  She denies hitting her head.  She is not on blood thinners.       Home Medications Prior to Admission medications   Medication Sig Start Date End Date Taking? Authorizing Provider  Cholecalciferol (VITAMIN D3) 1000 units CAPS Take 1 capsule by mouth daily.    [provider]  ferrous sulfate 325 (65 FE) MG EC tablet Take 1 tablet (325 mg total) by mouth daily with breakfast. 03/15/18 03/15/19  Danford, Earl Lites, MD  metFORMIN (GLUCOPHAGE) 1000 MG tablet Take 1,000 mg by mouth 2 (two) times daily with a meal.    [provider]  pantoprazole (PROTONIX) 40 MG tablet Take 1 tablet (40 mg total) by mouth 2 (two) times daily before a meal. 03/15/18   Danford, Earl Lites, MD  pravastatin (PRAVACHOL) 20 MG tablet Take 20 mg by mouth daily.    [provider]      Allergies    Patient has no known allergies.    Review of Systems   Review of Systems  Musculoskeletal:        BLE leg pain  All other systems reviewed and are negative.   Physical Exam Updated Vital Signs BP 103/67   Pulse 76   Temp 97.9 F (36.6 C) (Oral)   Resp (!) 25   Ht 5\' 3"  (1.6 m)   Wt 74 kg   SpO2 97%   BMI 28.90 kg/m  Physical Exam Vitals and nursing note reviewed.  Constitutional:      Appearance: Normal appearance.  HENT:     Head: Normocephalic and atraumatic.      Right Ear: External ear normal.     Left Ear: External ear normal.     Nose: Nose normal.     Mouth/Throat:     Mouth: Mucous membranes are moist.     Pharynx: Oropharynx is clear.  Eyes:     Extraocular Movements: Extraocular movements intact.     Conjunctiva/sclera: Conjunctivae normal.     Pupils: Pupils are equal, round, and reactive to light.  Cardiovascular:     Rate and Rhythm: Normal rate and regular rhythm.     Pulses: Normal pulses.     Heart sounds: Normal heart sounds.  Pulmonary:     Effort: Pulmonary effort is normal.     Breath sounds: Normal breath sounds.  Abdominal:     General: Abdomen is flat. Bowel sounds are normal.     Palpations: Abdomen is soft.  Musculoskeletal:        General: Normal range of motion.     Cervical back: Normal range of motion and neck supple.  Skin:    General: Skin is warm.     Capillary Refill: Capillary refill takes less than 2 seconds.  Neurological:     General: No focal deficit present.     Mental Status: She is alert and oriented to person, place, and time.     Comments: Pt knows her name, where she is.  She knows the month, but not the day.  Psychiatric:        Mood and Affect: Mood normal.        Behavior: Behavior normal.     ED Results / Procedures / Treatments   Labs (all labs ordered are listed, but only abnormal results are displayed) Labs Reviewed  CBC WITH DIFFERENTIAL/PLATELET - Abnormal; Notable for the following components:      Result Value   RBC 5.57 (*)    Hemoglobin 11.8 (*)    MCV 70.7 (*)    MCH 21.2 (*)    MCHC 29.9 (*)    Neutro Abs 8.2 (*)    All other components within normal limits  COMPREHENSIVE METABOLIC PANEL - Abnormal; Notable for the following components:   Sodium 133 (*)    CO2 19 (*)    Glucose, Bld 194 (*)    Creatinine, Ser 1.12 (*)    GFR, Estimated 53 (*)    All other components within normal limits  URINALYSIS, ROUTINE W REFLEX MICROSCOPIC - Abnormal; Notable for the following  components:   Ketones, ur 20 (*)    Protein, ur 30 (*)    All other components within normal limits  CK - Abnormal; Notable for the following components:   Total CK 236 (*)    All other components within normal limits  CBG MONITORING, ED - Abnormal; Notable for the following components:   Glucose-Capillary 181 (*)    All other components within normal limits    EKG EKG Interpretation Date/Time:  Thursday January 20 2023 10:06:16 EDT Ventricular Rate:  77 PR Interval:  157 QRS Duration:  97 QT Interval:  429 QTC Calculation: 486 R Axis:   -9  Text Interpretation: Sinus rhythm Borderline prolonged QT interval No significant change since last tracing Confirmed by Jacalyn Lefevre [phone]) on 01/20/2023 10:41:34 AM  Radiology DG Pelvis Portable  Result Date: 01/20/2023 CLINICAL DATA:  Pain after fall EXAM: PORTABLE PELVIS 1 VIEWS COMPARISON:  None Available. FINDINGS: No fracture or dislocation. Preserved joint spaces. Osteopenia. With this level of osteopenia if there is further concern subtle injury, CT can be useful for higher sensitivity. IMPRESSION: No acute osseous abnormality.  Osteopenia Electronically Signed   By: Karen Kays M.D.   On: 01/20/2023 11:45   DG Chest Portable 1 View  Result Date: 01/20/2023 CLINICAL DATA:  Pain after fall EXAM: PORTABLE CHEST 1 VIEW COMPARISON:  X-ray 03/13/2018 FINDINGS: Known hiatal hernia. Mild left basilar atelectasis. No consolidation, pneumothorax, effusion or edema. Calcified aorta. Overlapping cardiac leads. IMPRESSION: Hiatal hernia.  Left basilar atelectasis or scar. Electronically Signed   By: Karen Kays M.D.   On: 01/20/2023 11:44   CT Head Wo Contrast  Result Date: 01/20/2023 CLINICAL DATA:  Trauma, pain EXAM: CT HEAD WITHOUT CONTRAST CT CERVICAL SPINE WITHOUT CONTRAST TECHNIQUE: Multidetector CT imaging of the head and cervical spine was performed following the standard protocol without intravenous contrast. Multiplanar CT image  reconstructions of the cervical spine were also generated. RADIATION DOSE REDUCTION: This exam was performed according to the departmental dose-optimization program which includes automated exposure control, adjustment of the mA and/or kV according to patient size and/or use of iterative reconstruction technique. COMPARISON:  None Available. FINDINGS: CT HEAD FINDINGS Brain: No  evidence of acute infarction, hemorrhage, hydrocephalus, extra-axial collection or mass lesion/mass effect. Vascular: No hyperdense vessel or unexpected calcification. Skull: Normal. Negative for fracture or focal lesion. Sinuses/Orbits: No acute finding. Other: None. CT CERVICAL SPINE FINDINGS Alignment: Normal. Skull base and vertebrae: No acute fracture. No primary bone lesion or focal pathologic process. Soft tissues and spinal canal: No prevertebral fluid or swelling. No visible canal hematoma. Disc levels:  Intact. Upper chest: Negative. Other: None. IMPRESSION: 1. No acute intracranial pathology. 2. No fracture or static subluxation of the cervical spine. Electronically Signed   By: Jearld Lesch M.D.   On: 01/20/2023 11:34   CT Cervical Spine Wo Contrast  Result Date: 01/20/2023 CLINICAL DATA:  Trauma, pain EXAM: CT HEAD WITHOUT CONTRAST CT CERVICAL SPINE WITHOUT CONTRAST TECHNIQUE: Multidetector CT imaging of the head and cervical spine was performed following the standard protocol without intravenous contrast. Multiplanar CT image reconstructions of the cervical spine were also generated. RADIATION DOSE REDUCTION: This exam was performed according to the departmental dose-optimization program which includes automated exposure control, adjustment of the mA and/or kV according to patient size and/or use of iterative reconstruction technique. COMPARISON:  None Available. FINDINGS: CT HEAD FINDINGS Brain: No evidence of acute infarction, hemorrhage, hydrocephalus, extra-axial collection or mass lesion/mass effect. Vascular: No  hyperdense vessel or unexpected calcification. Skull: Normal. Negative for fracture or focal lesion. Sinuses/Orbits: No acute finding. Other: None. CT CERVICAL SPINE FINDINGS Alignment: Normal. Skull base and vertebrae: No acute fracture. No primary bone lesion or focal pathologic process. Soft tissues and spinal canal: No prevertebral fluid or swelling. No visible canal hematoma. Disc levels:  Intact. Upper chest: Negative. Other: None. IMPRESSION: 1. No acute intracranial pathology. 2. No fracture or static subluxation of the cervical spine. Electronically Signed   By: Jearld Lesch M.D.   On: 01/20/2023 11:34    Procedures Procedures    Medications Ordered in ED Medications  sodium chloride 0.9 % bolus 500 mL (0 mLs Intravenous Stopped 01/20/23 1159)  sodium chloride 0.9 % bolus 500 mL (500 mLs Intravenous Bolus 01/20/23 1302)    ED Course/ Medical Decision Making/ A&P                                 Medical Decision Making Amount and/or Complexity of Data Reviewed Labs: ordered. Radiology: ordered.   This patient presents to the ED for concern of ams, this involves an extensive number of treatment options, and is a complaint that carries with it a high risk of complications and morbidity.  The differential diagnosis includes cva, trauma, infection   Co morbidities that complicate the patient evaluation  dm, anemia, cognitive impairment, hld, gastric ulcer, htn, and pvd.   Additional history obtained:  Additional history obtained from epic chart review External records from outside source obtained and reviewed including EMS report   Lab Tests:  I Ordered, and personally interpreted labs.  The pertinent results include:  cbc with hgb 11.8(hgb 6.7 in October), ua nl, cmp nl, ck sl elevated at 236   Imaging Studies ordered:  I ordered imaging studies including cxr, pelvis, ct head/c-spine  I independently visualized and interpreted imaging which showed  CXR: Hiatal hernia.   Left basilar atelectasis or scar.  Pelvis: No acute osseous abnormality.  Osteopenia  CT head/neck:  No acute intracranial pathology.  2. No fracture or static subluxation of the cervical spine.   I agree with the radiologist interpretation  Cardiac Monitoring:  The patient was maintained on a cardiac monitor.  I personally viewed and interpreted the cardiac monitored which showed an underlying rhythm of: nsr   Medicines ordered and prescription drug management:  I ordered medication including ivfs  for sx  Reevaluation of the patient after these medicines showed that the patient improved I have reviewed the patients home medicines and have made adjustments as needed   Test Considered:  ct   Critical Interventions:  ivfs   Problem List / ED Course:  Fall:  no fx.  Pt is able to ambulate well. AMS:  sister said she is back to her normal.  She did have a period of some confusion while here, but I suspect she was just trying to get oob.  She does have some mild MRI.  Sister comfortable taking her home and pt is ready to go home.   Reevaluation:  After the interventions noted above, I reevaluated the patient and found that they have :improved   Social Determinants of Health:  Lives alone   Dispostion:  After consideration of the diagnostic results and the patients response to treatment, I feel that the patent would benefit from discharge with outpatient f/u.          Final Clinical Impression(s) / ED Diagnoses Final diagnoses:  Fall, initial encounter  Dehydration    Rx / DC Orders ED Discharge Orders     None         Jacalyn Lefevre, MD 01/20/23 [phone]

## 2023-01-20 NOTE — ED Triage Notes (Signed)
Pt present to ED from home with pain to BLE s/p fall yesterday. Pt states to tripping over rug in living room. Per EMS, sister states speaking with pt yesterday at 1830, states pt's speech was slurred, pt denies change in speech to EMS. Sister states pt is A&Ox3 at baseline, pt currently A&Ox2 at this time. Pt denies hitting head, denies blood thinners at this time.   EMS VS: 128/66 80 100% room air 259 CBG

## 2023-01-20 NOTE — ED Notes (Signed)
Pt ambulated to bathroom without difficulty. Steady gait noted.

## 2023-01-20 NOTE — ED Notes (Signed)
Pt noted out of bed, standing upright to sink. Blood noted draining from left forearm, blood noted on floor and sheets. Pt discontinued IV from left forearm, stating IV did not work. Pt placed back into bed, cardiac leads and blood pressure cuff applied. Pt states she is ready to leave, voiced to pt to remain in bed and sister will be called. Dr Particia Nearing aware.

## 2023-01-20 NOTE — ED Notes (Signed)
Jennie Sue(sister) left contact number [phone]

## 2023-02-06 IMAGING — MG MM DIGITAL SCREENING BILAT W/ TOMO AND CAD
8 of 15 series · 8 of 40 positions shown · non-contrast
Comparison: Previous exam(s).

CLINICAL DATA: Screening.

EXAM:
DIGITAL SCREENING BILATERAL MAMMOGRAM WITH TOMOSYNTHESIS AND CAD
TECHNIQUE: Bilateral screening digital craniocaudal and mediolateral oblique
mammograms were obtained. Bilateral screening digital breast
tomosynthesis was performed. The images were evaluated with
computer-aided detection.

[L CC synth-2D (1 of 2)]
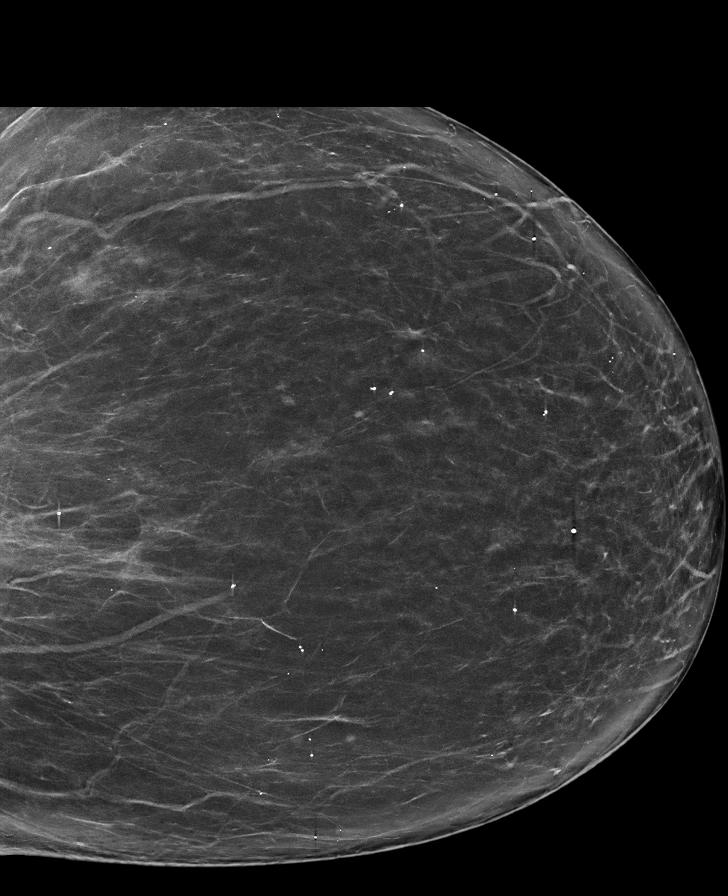

[R CC synth-2D (1 of 2)]
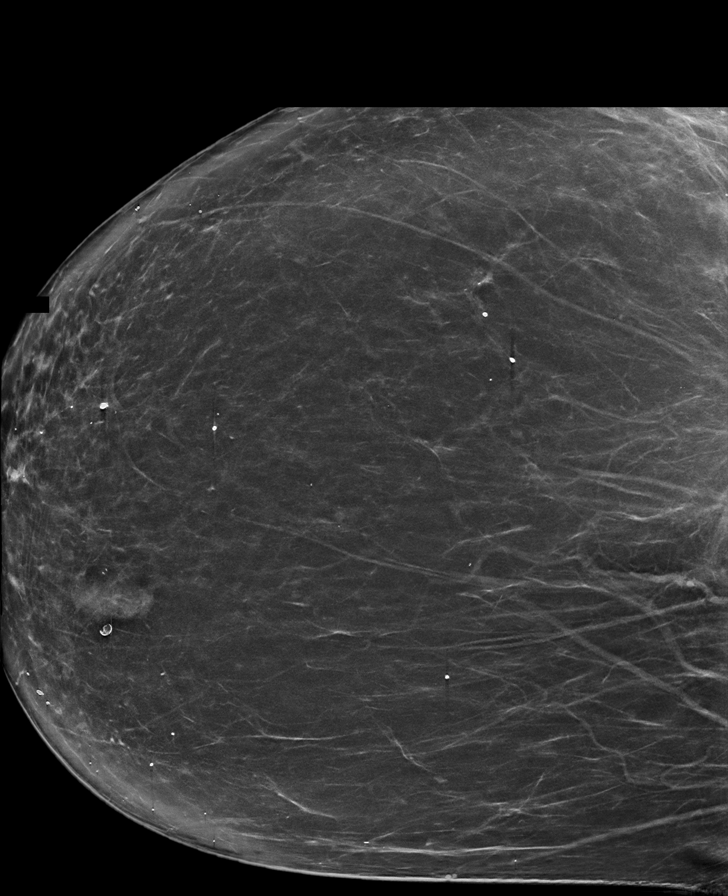

[L CC synth-2D (2 of 2)]
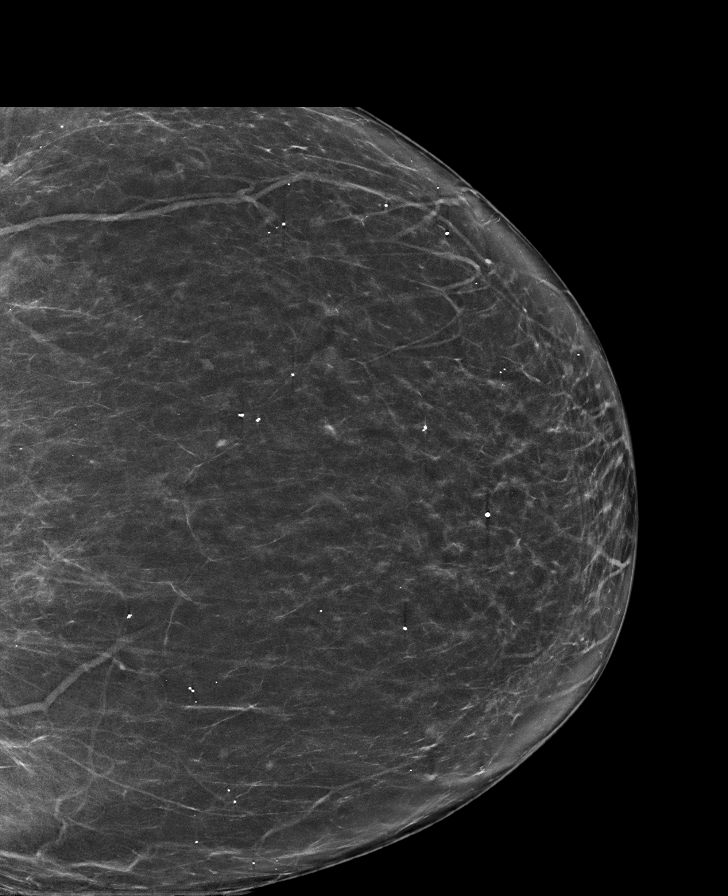

[L MLO synth-2D (1 of 2)]
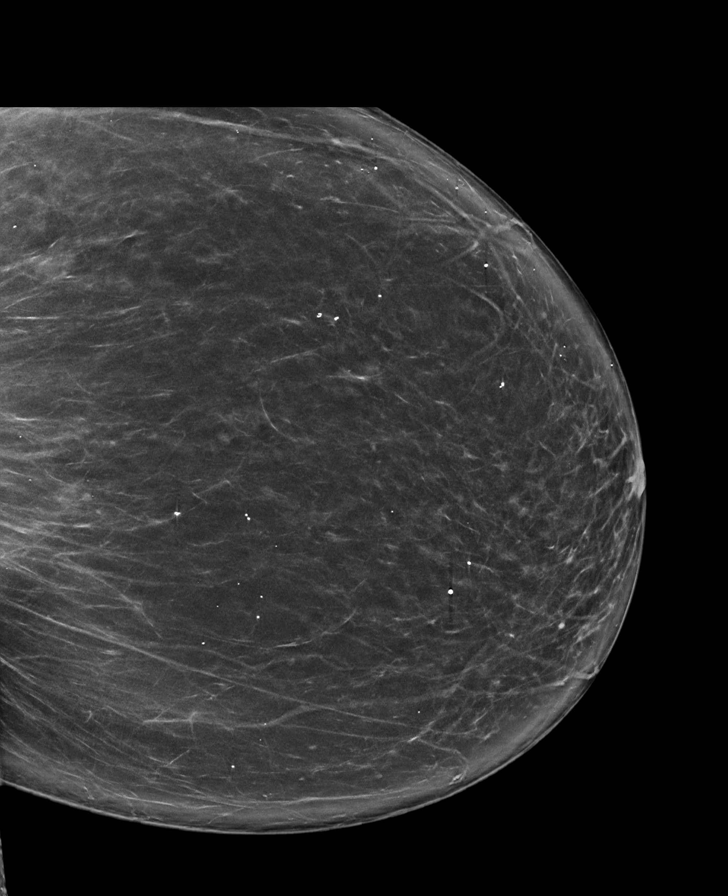

[L MLO synth-2D (2 of 2)]
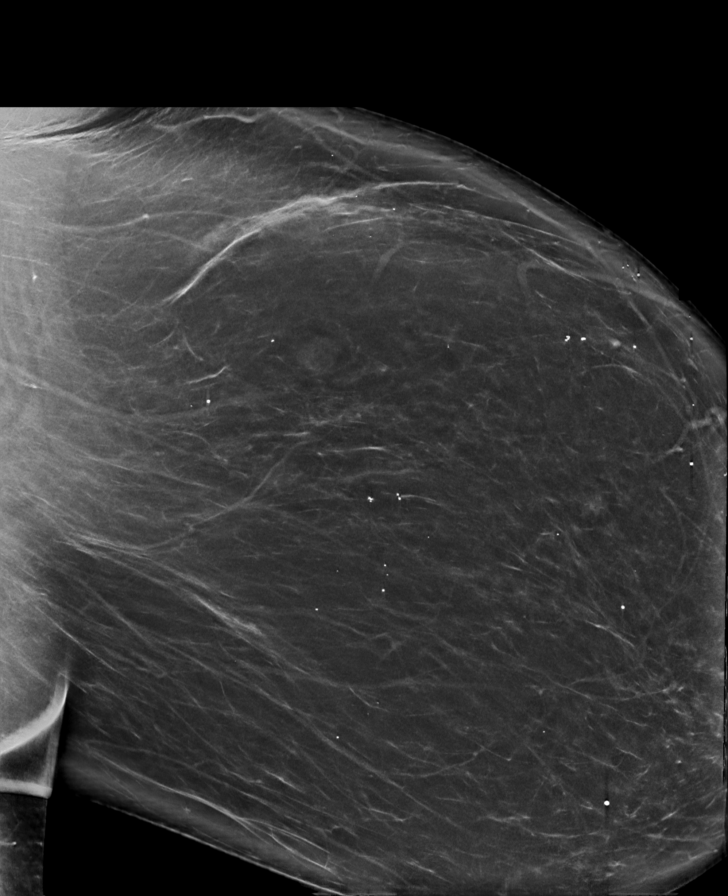

[R MLO synth-2D]
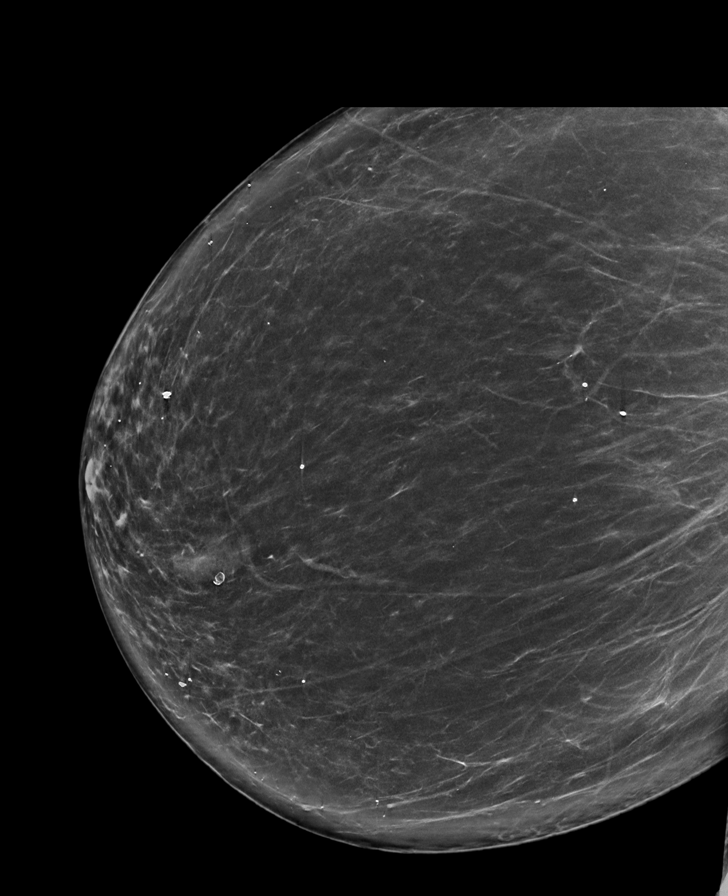

[R CC synth-2D (2 of 2)]
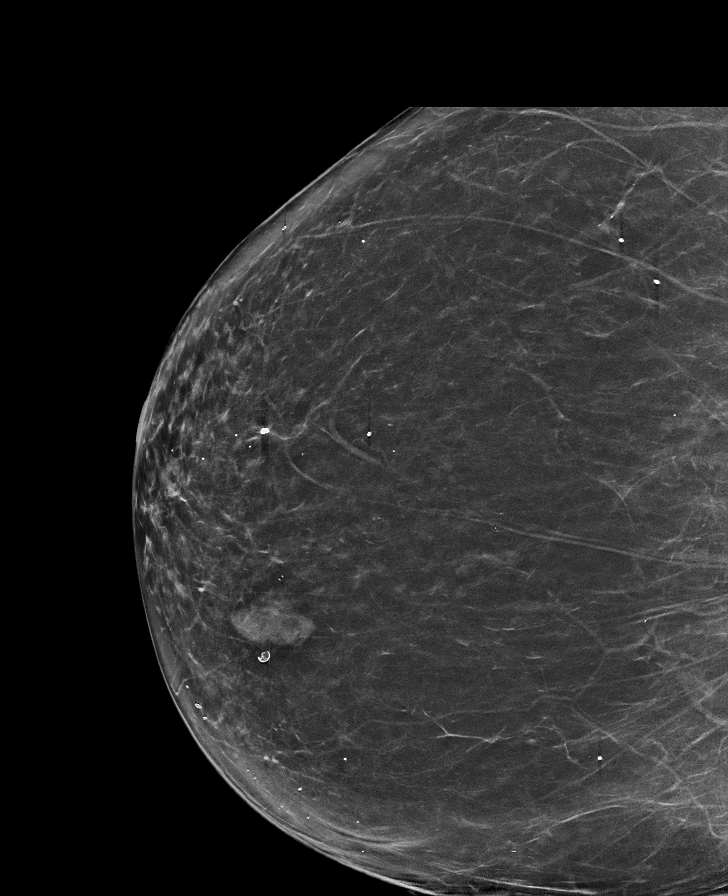

[L CC tomo · tomo slice 57/84.0]
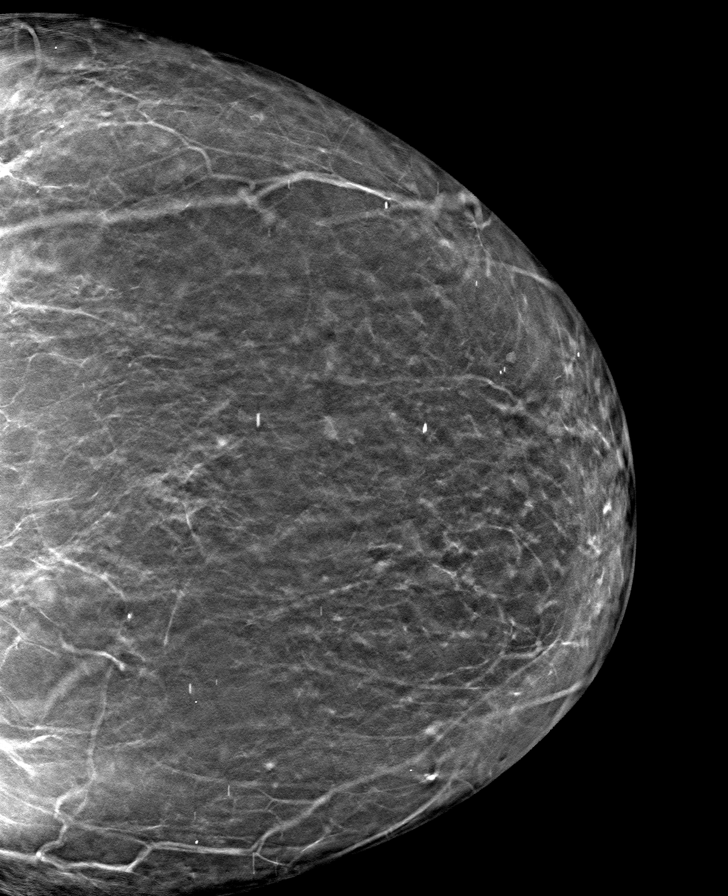

[8 of 40 positions shown; findings below may reference images not displayed]

ACR Breast Density Category b: There are scattered areas of
fibroglandular density.
FINDINGS: There are no findings suspicious for malignancy.
IMPRESSION: No mammographic evidence of malignancy. A result letter of this
screening mammogram will be mailed directly to the patient.

RECOMMENDATION:
Screening mammogram in one year. (Code:51-O-LD2)

BI-RADS CATEGORY  1: Negative.

## 2023-02-08 ENCOUNTER — Ambulatory Visit
Admission: RE | Admit: 2023-02-08 | Discharge: 2023-02-08 | Disposition: A | Discharge status: Home / Self Care | Payer: Medicare (Managed Care) | Source: Ambulatory Visit | Attending: Vascular Surgery | Admitting: Vascular Surgery

## 2023-02-08 DIAGNOSIS — E1151 Type 2 diabetes mellitus with diabetic peripheral angiopathy without gangrene: Secondary | ICD-10-CM

## 2023-02-28 ENCOUNTER — Ambulatory Visit: Payer: Medicare (Managed Care)

## 2023-03-29 ENCOUNTER — Ambulatory Visit: Payer: Medicare (Managed Care)

## 2023-11-02 ENCOUNTER — Other Ambulatory Visit: Payer: Self-pay | Admitting: Vascular Surgery

## 2023-11-02 DIAGNOSIS — Z1231 Encounter for screening mammogram for malignant neoplasm of breast: Secondary | ICD-10-CM
